# Patient Record
Sex: Female | Born: 1950 | Race: White | Hispanic: No | Marital: Married | Smoking: Never smoker
Health system: Southern US, Community
[De-identification: ages and names within clinical notes are randomized; demographics above are authoritative.]

## PROBLEM LIST (undated history)

## (undated) DIAGNOSIS — Z8781 Personal history of (healed) traumatic fracture: Secondary | ICD-10-CM

## (undated) DIAGNOSIS — L409 Psoriasis, unspecified: Secondary | ICD-10-CM

## (undated) DIAGNOSIS — I82409 Acute embolism and thrombosis of unspecified deep veins of unspecified lower extremity: Secondary | ICD-10-CM

## (undated) DIAGNOSIS — E785 Hyperlipidemia, unspecified: Secondary | ICD-10-CM

## (undated) DIAGNOSIS — M810 Age-related osteoporosis without current pathological fracture: Secondary | ICD-10-CM

## (undated) DIAGNOSIS — Z923 Personal history of irradiation: Secondary | ICD-10-CM

## (undated) DIAGNOSIS — Z170 Estrogen receptor positive status [ER+]: Secondary | ICD-10-CM

## (undated) DIAGNOSIS — C50812 Malignant neoplasm of overlapping sites of left female breast: Secondary | ICD-10-CM

## (undated) DIAGNOSIS — I2510 Atherosclerotic heart disease of native coronary artery without angina pectoris: Secondary | ICD-10-CM

## (undated) DIAGNOSIS — Z853 Personal history of malignant neoplasm of breast: Secondary | ICD-10-CM

## (undated) DIAGNOSIS — E039 Hypothyroidism, unspecified: Secondary | ICD-10-CM

## (undated) DIAGNOSIS — I10 Essential (primary) hypertension: Secondary | ICD-10-CM

## (undated) DIAGNOSIS — C50919 Malignant neoplasm of unspecified site of unspecified female breast: Secondary | ICD-10-CM

## (undated) DIAGNOSIS — H353290 Exudative age-related macular degeneration, unspecified eye, stage unspecified: Secondary | ICD-10-CM

## (undated) HISTORY — DX: Age-related osteoporosis without current pathological fracture: M81.0

## (undated) HISTORY — PX: BACK SURGERY: SHX140

## (undated) HISTORY — DX: Psoriasis, unspecified: L40.9

## (undated) HISTORY — DX: Estrogen receptor positive status (ER+): Z17.0

## (undated) HISTORY — PX: CARDIAC CATHETERIZATION: SHX172

## (undated) HISTORY — DX: Personal history of malignant neoplasm of breast: Z85.3

## (undated) HISTORY — DX: Essential (primary) hypertension: I10

## (undated) HISTORY — DX: Hyperlipidemia, unspecified: E78.5

## (undated) HISTORY — DX: Malignant neoplasm of overlapping sites of left female breast: C50.812

## (undated) HISTORY — DX: Personal history of irradiation: Z92.3

## (undated) HISTORY — DX: Acute embolism and thrombosis of unspecified deep veins of unspecified lower extremity: I82.409

## (undated) HISTORY — PX: EYE SURGERY: SHX253

## (undated) HISTORY — PX: TUBAL LIGATION: SHX77

---

## 1968-08-12 DIAGNOSIS — I82409 Acute embolism and thrombosis of unspecified deep veins of unspecified lower extremity: Secondary | ICD-10-CM

## 1968-08-12 HISTORY — DX: Acute embolism and thrombosis of unspecified deep veins of unspecified lower extremity: I82.409

## 1997-08-12 HISTORY — PX: FOOT SURGERY: SHX648

## 2004-11-13 ENCOUNTER — Ambulatory Visit: Payer: Self-pay | Admitting: Internal Medicine

## 2005-11-28 ENCOUNTER — Ambulatory Visit: Payer: Self-pay | Admitting: Internal Medicine

## 2005-12-20 ENCOUNTER — Ambulatory Visit: Payer: Self-pay | Admitting: Internal Medicine

## 2006-02-25 ENCOUNTER — Ambulatory Visit: Payer: Self-pay | Admitting: Gastroenterology

## 2006-12-16 ENCOUNTER — Ambulatory Visit: Payer: Self-pay | Admitting: Internal Medicine

## 2007-12-23 ENCOUNTER — Ambulatory Visit: Payer: Self-pay | Admitting: Internal Medicine

## 2007-12-29 ENCOUNTER — Ambulatory Visit: Payer: Self-pay | Admitting: Internal Medicine

## 2008-07-04 ENCOUNTER — Ambulatory Visit: Payer: Self-pay | Admitting: Internal Medicine

## 2008-12-29 ENCOUNTER — Ambulatory Visit: Payer: Self-pay | Admitting: Internal Medicine

## 2009-08-12 DIAGNOSIS — C50919 Malignant neoplasm of unspecified site of unspecified female breast: Secondary | ICD-10-CM

## 2009-08-12 DIAGNOSIS — Z923 Personal history of irradiation: Secondary | ICD-10-CM

## 2009-08-12 HISTORY — DX: Personal history of irradiation: Z92.3

## 2009-08-12 HISTORY — DX: Malignant neoplasm of unspecified site of unspecified female breast: C50.919

## 2010-01-17 ENCOUNTER — Ambulatory Visit: Payer: Self-pay | Admitting: Internal Medicine

## 2010-01-29 ENCOUNTER — Ambulatory Visit: Payer: Self-pay | Admitting: Internal Medicine

## 2010-02-13 ENCOUNTER — Ambulatory Visit: Payer: Self-pay | Admitting: Surgery

## 2010-02-16 LAB — PATHOLOGY REPORT

## 2010-03-01 ENCOUNTER — Ambulatory Visit: Payer: Self-pay | Admitting: Surgery

## 2010-03-08 ENCOUNTER — Ambulatory Visit: Payer: Self-pay | Admitting: Surgery

## 2010-03-08 HISTORY — PX: BREAST BIOPSY: SHX20

## 2010-03-12 ENCOUNTER — Ambulatory Visit: Payer: Self-pay | Admitting: Internal Medicine

## 2010-03-13 LAB — PATHOLOGY REPORT

## 2010-03-25 DIAGNOSIS — Z853 Personal history of malignant neoplasm of breast: Secondary | ICD-10-CM

## 2010-03-25 HISTORY — DX: Personal history of malignant neoplasm of breast: Z85.3

## 2010-03-28 ENCOUNTER — Ambulatory Visit: Payer: Self-pay | Admitting: Surgery

## 2010-03-28 HISTORY — PX: BREAST LUMPECTOMY: SHX2

## 2010-04-03 LAB — PATHOLOGY REPORT

## 2010-04-11 ENCOUNTER — Ambulatory Visit: Payer: Self-pay | Admitting: Internal Medicine

## 2010-04-12 ENCOUNTER — Ambulatory Visit: Payer: Self-pay | Admitting: Internal Medicine

## 2010-04-25 ENCOUNTER — Ambulatory Visit: Payer: Self-pay | Admitting: Internal Medicine

## 2010-05-12 ENCOUNTER — Ambulatory Visit: Payer: Self-pay | Admitting: Internal Medicine

## 2010-06-12 ENCOUNTER — Ambulatory Visit: Payer: Self-pay | Admitting: Internal Medicine

## 2010-07-12 ENCOUNTER — Ambulatory Visit: Payer: Self-pay | Admitting: Internal Medicine

## 2010-08-12 ENCOUNTER — Ambulatory Visit: Payer: Self-pay | Admitting: Internal Medicine

## 2010-09-21 ENCOUNTER — Ambulatory Visit: Payer: Self-pay | Admitting: Internal Medicine

## 2010-10-11 ENCOUNTER — Ambulatory Visit: Payer: Self-pay | Admitting: Internal Medicine

## 2010-12-26 ENCOUNTER — Ambulatory Visit: Payer: Self-pay | Admitting: Internal Medicine

## 2011-01-11 ENCOUNTER — Ambulatory Visit: Payer: Self-pay | Admitting: Internal Medicine

## 2011-02-10 ENCOUNTER — Ambulatory Visit: Payer: Self-pay | Admitting: Internal Medicine

## 2011-03-28 ENCOUNTER — Ambulatory Visit: Payer: Self-pay | Admitting: Internal Medicine

## 2011-05-29 ENCOUNTER — Ambulatory Visit: Payer: Self-pay | Admitting: Internal Medicine

## 2011-06-13 ENCOUNTER — Ambulatory Visit: Payer: Self-pay | Admitting: Internal Medicine

## 2011-07-13 ENCOUNTER — Ambulatory Visit: Payer: Self-pay | Admitting: Internal Medicine

## 2011-09-30 ENCOUNTER — Ambulatory Visit: Payer: Self-pay | Admitting: Surgery

## 2011-10-02 ENCOUNTER — Ambulatory Visit: Payer: Self-pay | Admitting: Internal Medicine

## 2011-10-02 LAB — CBC CANCER CENTER
Basophil #: 0 x10 3/mm (ref 0.0–0.1)
Eosinophil #: 0.2 x10 3/mm (ref 0.0–0.7)
HCT: 40.9 % (ref 35.0–47.0)
Lymphocyte #: 1.1 x10 3/mm (ref 1.0–3.6)
MCHC: 34.3 g/dL (ref 32.0–36.0)
MCV: 94 fL (ref 80–100)
Monocyte #: 0.7 x10 3/mm (ref 0.0–0.7)
Monocyte %: 11 %
Neutrophil #: 4 x10 3/mm (ref 1.4–6.5)
Platelet: 232 x10 3/mm (ref 150–440)
RDW: 13.2 % (ref 11.5–14.5)
WBC: 6 x10 3/mm (ref 3.6–11.0)

## 2011-10-02 LAB — HEPATIC FUNCTION PANEL A (ARMC)
Alkaline Phosphatase: 84 U/L (ref 50–136)
Bilirubin,Total: 0.4 mg/dL (ref 0.2–1.0)
SGOT(AST): 26 U/L (ref 15–37)
Total Protein: 7.5 g/dL (ref 6.4–8.2)

## 2011-10-02 LAB — CREATININE, SERUM: EGFR (Non-African Amer.): >60

## 2011-10-11 ENCOUNTER — Ambulatory Visit: Payer: Self-pay | Admitting: Internal Medicine

## 2012-03-30 ENCOUNTER — Ambulatory Visit: Payer: Self-pay | Admitting: Internal Medicine

## 2012-05-08 ENCOUNTER — Ambulatory Visit: Payer: Self-pay | Admitting: Internal Medicine

## 2012-05-08 LAB — HEPATIC FUNCTION PANEL A (ARMC)
Albumin: 3.6 g/dL (ref 3.4–5.0)
Alkaline Phosphatase: 75 U/L (ref 50–136)
Bilirubin, Direct: 0.1 mg/dL (ref 0.00–0.20)
Bilirubin,Total: 0.3 mg/dL (ref 0.2–1.0)
SGOT(AST): 23 U/L (ref 15–37)
SGPT (ALT): 20 U/L (ref 12–78)
Total Protein: 7.5 g/dL (ref 6.4–8.2)

## 2012-05-08 LAB — CBC CANCER CENTER
Basophil #: 0 x10 3/mm (ref 0.0–0.1)
Basophil %: 0.6 %
Eosinophil #: 0.1 x10 3/mm (ref 0.0–0.7)
Eosinophil %: 1.4 %
HCT: 42.9 % (ref 35.0–47.0)
HGB: 13.9 g/dL (ref 12.0–16.0)
Lymphocyte #: 1.2 x10 3/mm (ref 1.0–3.6)
Lymphocyte %: 18.3 %
MCH: 31.7 pg (ref 26.0–34.0)
MCHC: 32.5 g/dL (ref 32.0–36.0)
MCV: 98 fL (ref 80–100)
Monocyte #: 0.7 x10 3/mm (ref 0.2–0.9)
Monocyte %: 11.2 %
Neutrophil #: 4.5 x10 3/mm (ref 1.4–6.5)
Neutrophil %: 68.5 %
Platelet: 227 x10 3/mm (ref 150–440)
RBC: 4.38 10*6/uL (ref 3.80–5.20)
RDW: 13.1 % (ref 11.5–14.5)
WBC: 6.5 x10 3/mm (ref 3.6–11.0)

## 2012-05-08 LAB — CREATININE, SERUM: EGFR (Non-African Amer.): >60

## 2012-05-12 ENCOUNTER — Ambulatory Visit: Payer: Self-pay | Admitting: Internal Medicine

## 2012-06-24 ENCOUNTER — Ambulatory Visit: Payer: Self-pay | Admitting: Internal Medicine

## 2012-07-12 ENCOUNTER — Ambulatory Visit: Payer: Self-pay | Admitting: Internal Medicine

## 2012-08-12 ENCOUNTER — Ambulatory Visit: Payer: Self-pay | Admitting: Internal Medicine

## 2013-04-01 ENCOUNTER — Ambulatory Visit: Payer: Self-pay | Admitting: Internal Medicine

## 2013-05-07 ENCOUNTER — Ambulatory Visit: Payer: Self-pay | Admitting: Internal Medicine

## 2013-05-10 LAB — HEPATIC FUNCTION PANEL A (ARMC): Bilirubin, Direct: 0.1 mg/dL (ref 0.00–0.20)

## 2013-05-10 LAB — CBC CANCER CENTER
Basophil %: 0.9 %
Eosinophil #: 0.1 x10 3/mm (ref 0.0–0.7)
Eosinophil %: 1.5 %
HGB: 14.5 g/dL (ref 12.0–16.0)
Lymphocyte #: 1.4 x10 3/mm (ref 1.0–3.6)
MCH: 32 pg (ref 26.0–34.0)
MCHC: 33.4 g/dL (ref 32.0–36.0)
MCV: 96 fL (ref 80–100)
Monocyte #: 0.8 x10 3/mm (ref 0.2–0.9)
Monocyte %: 10.1 %
Neutrophil #: 5.5 x10 3/mm (ref 1.4–6.5)
Neutrophil %: 69.2 %
RBC: 4.55 10*6/uL (ref 3.80–5.20)
RDW: 12.9 % (ref 11.5–14.5)

## 2013-05-10 LAB — CREATININE, SERUM
Creatinine: 0.86 mg/dL (ref 0.60–1.30)
EGFR (Non-African Amer.): >60

## 2013-05-12 ENCOUNTER — Ambulatory Visit: Payer: Self-pay | Admitting: Internal Medicine

## 2013-06-23 ENCOUNTER — Ambulatory Visit: Payer: Self-pay | Admitting: Radiation Oncology

## 2013-07-12 ENCOUNTER — Ambulatory Visit: Payer: Self-pay | Admitting: Radiation Oncology

## 2014-04-04 ENCOUNTER — Ambulatory Visit: Payer: Self-pay | Admitting: Internal Medicine

## 2014-05-11 ENCOUNTER — Ambulatory Visit: Payer: Self-pay | Admitting: Internal Medicine

## 2014-05-11 LAB — CBC CANCER CENTER
Basophil #: 0 x10 3/mm (ref 0.0–0.1)
Basophil %: 0.5 %
Eosinophil #: 0.1 x10 3/mm (ref 0.0–0.7)
Eosinophil %: 1.2 %
HCT: 44.6 % (ref 35.0–47.0)
HGB: 14.5 g/dL (ref 12.0–16.0)
LYMPHS PCT: 19.6 %
Lymphocyte #: 1.2 x10 3/mm (ref 1.0–3.6)
MCH: 31.4 pg (ref 26.0–34.0)
MCHC: 32.5 g/dL (ref 32.0–36.0)
MCV: 96 fL (ref 80–100)
MONO ABS: 0.6 x10 3/mm (ref 0.2–0.9)
Monocyte %: 10 %
NEUTROS ABS: 4.3 x10 3/mm (ref 1.4–6.5)
Neutrophil %: 68.7 %
PLATELETS: 259 x10 3/mm (ref 150–440)
RBC: 4.62 10*6/uL (ref 3.80–5.20)
RDW: 12.9 % (ref 11.5–14.5)
WBC: 6.2 x10 3/mm (ref 3.6–11.0)

## 2014-05-11 LAB — CREATININE, SERUM
Creatinine: 0.77 mg/dL (ref 0.60–1.30)
EGFR (Non-African Amer.): >60

## 2014-05-11 LAB — HEPATIC FUNCTION PANEL A (ARMC)
ALBUMIN: 3.9 g/dL (ref 3.4–5.0)
Alkaline Phosphatase: 73 U/L
Bilirubin, Direct: 0.1 mg/dL (ref 0.00–0.20)
Bilirubin,Total: 0.3 mg/dL (ref 0.2–1.0)
SGOT(AST): 25 U/L (ref 15–37)
SGPT (ALT): 26 U/L
Total Protein: 7.5 g/dL (ref 6.4–8.2)

## 2014-05-12 ENCOUNTER — Ambulatory Visit: Payer: Self-pay | Admitting: Internal Medicine

## 2014-07-22 ENCOUNTER — Ambulatory Visit: Payer: Self-pay | Admitting: Internal Medicine

## 2014-08-12 ENCOUNTER — Ambulatory Visit: Payer: Self-pay | Admitting: Internal Medicine

## 2015-02-07 ENCOUNTER — Other Ambulatory Visit: Payer: Self-pay

## 2015-02-07 DIAGNOSIS — Z853 Personal history of malignant neoplasm of breast: Secondary | ICD-10-CM

## 2015-04-06 ENCOUNTER — Other Ambulatory Visit: Payer: Self-pay | Admitting: Internal Medicine

## 2015-04-06 ENCOUNTER — Ambulatory Visit: Payer: Self-pay

## 2015-04-06 ENCOUNTER — Ambulatory Visit
Admission: RE | Admit: 2015-04-06 | Discharge: 2015-04-06 | Disposition: A | Discharge status: Home / Self Care | Payer: Medicare Other | Source: Ambulatory Visit | Attending: Internal Medicine | Admitting: Internal Medicine

## 2015-04-06 DIAGNOSIS — Z853 Personal history of malignant neoplasm of breast: Secondary | ICD-10-CM | POA: Insufficient documentation

## 2015-04-06 HISTORY — DX: Malignant neoplasm of unspecified site of unspecified female breast: C50.919

## 2015-05-08 ENCOUNTER — Other Ambulatory Visit: Payer: Self-pay | Admitting: Internal Medicine

## 2015-05-12 ENCOUNTER — Inpatient Hospital Stay: Payer: Medicare Other | Admitting: Internal Medicine

## 2015-05-12 ENCOUNTER — Inpatient Hospital Stay: Payer: Medicare Other

## 2015-05-25 ENCOUNTER — Inpatient Hospital Stay: Payer: Medicare Other | Attending: Family Medicine | Admitting: Internal Medicine

## 2015-05-25 ENCOUNTER — Encounter: Payer: Self-pay | Admitting: *Deleted

## 2015-05-25 ENCOUNTER — Other Ambulatory Visit: Payer: Self-pay | Admitting: *Deleted

## 2015-05-25 ENCOUNTER — Inpatient Hospital Stay: Payer: Medicare Other

## 2015-05-25 VITALS — BP 149/86 | HR 75 | Temp 98.0°F | Ht 60.0 in | Wt 127.4 lb

## 2015-05-25 DIAGNOSIS — Z79811 Long term (current) use of aromatase inhibitors: Secondary | ICD-10-CM | POA: Diagnosis not present

## 2015-05-25 DIAGNOSIS — C50912 Malignant neoplasm of unspecified site of left female breast: Secondary | ICD-10-CM | POA: Diagnosis present

## 2015-05-25 DIAGNOSIS — Z7982 Long term (current) use of aspirin: Secondary | ICD-10-CM | POA: Diagnosis not present

## 2015-05-25 DIAGNOSIS — E785 Hyperlipidemia, unspecified: Secondary | ICD-10-CM | POA: Insufficient documentation

## 2015-05-25 DIAGNOSIS — I10 Essential (primary) hypertension: Secondary | ICD-10-CM | POA: Diagnosis not present

## 2015-05-25 DIAGNOSIS — Z86718 Personal history of other venous thrombosis and embolism: Secondary | ICD-10-CM | POA: Insufficient documentation

## 2015-05-25 DIAGNOSIS — L409 Psoriasis, unspecified: Secondary | ICD-10-CM | POA: Insufficient documentation

## 2015-05-25 DIAGNOSIS — M810 Age-related osteoporosis without current pathological fracture: Secondary | ICD-10-CM | POA: Insufficient documentation

## 2015-05-25 DIAGNOSIS — Z79899 Other long term (current) drug therapy: Secondary | ICD-10-CM | POA: Insufficient documentation

## 2015-05-25 DIAGNOSIS — D0592 Unspecified type of carcinoma in situ of left breast: Secondary | ICD-10-CM

## 2015-05-25 DIAGNOSIS — Z170 Estrogen receptor positive status [ER+]: Secondary | ICD-10-CM | POA: Diagnosis not present

## 2015-05-25 LAB — CBC WITH DIFFERENTIAL/PLATELET
Basophils Absolute: 0.1 10*3/uL (ref 0–0.1)
Basophils Relative: 1 %
Eosinophils Absolute: 0.1 10*3/uL (ref 0–0.7)
Eosinophils Relative: 2 %
HEMATOCRIT: 41 % (ref 35.0–47.0)
Hemoglobin: 13.9 g/dL (ref 12.0–16.0)
LYMPHS ABS: 1.2 10*3/uL (ref 1.0–3.6)
Lymphocytes Relative: 18 %
MCH: 31.5 pg (ref 26.0–34.0)
MCHC: 33.8 g/dL (ref 32.0–36.0)
MCV: 93.2 fL (ref 80.0–100.0)
MONO ABS: 0.7 10*3/uL (ref 0.2–0.9)
MONOS PCT: 11 %
NEUTROS ABS: 4.4 10*3/uL (ref 1.4–6.5)
Neutrophils Relative %: 68 %
Platelets: 273 10*3/uL (ref 150–440)
RBC: 4.4 MIL/uL (ref 3.80–5.20)
RDW: 12.7 % (ref 11.5–14.5)
WBC: 6.4 10*3/uL (ref 3.6–11.0)

## 2015-05-25 LAB — HEPATIC FUNCTION PANEL
ALT: 15 U/L (ref 14–54)
AST: 26 U/L (ref 15–41)
Albumin: 3.8 g/dL (ref 3.5–5.0)
Alkaline Phosphatase: 71 U/L (ref 38–126)
BILIRUBIN TOTAL: 0.5 mg/dL (ref 0.3–1.2)
Total Protein: 7.2 g/dL (ref 6.5–8.1)

## 2015-05-25 LAB — CREATININE, SERUM
Creatinine, Ser: 0.71 mg/dL (ref 0.44–1.00)
GFR calc Af Amer: >60 mL/min (ref >60)
GFR calc non Af Amer: >60 mL/min (ref >60)

## 2015-05-25 NOTE — Progress Notes (Signed)
[place] OFFICE PROGRESS NOTE  No care team member to display   SUMMARY OF ONCOLOGIC HISTORY:  # LEFT BREAST CA STAGE I ER/PR POS; Her 2 NEU-NEG s/p Lump & RT; LOW RISK ONCOTYPE- No chemo; NOV 2011- START ARIMIDEX  # OSTEOPOROSIS- on Fosomax + Vit D & ca ; BMD- STABLE osteporosis   INTERVAL HISTORY:  A pleasant 64 year old female patient with above history of stage I breast cancer currently on adjuvant Arimidex is here for follow-up. Patient denies any unusual bone pain or unusual muscle aches or joint pains. She denies any significant hot flashes.  Denies any new lumps or bumps. Denies any unusual weight loss nausea vomiting or chest pain or cough.  REVIEW OF SYSTEMS:  A complete 10 point review of system is done which is negative except mentioned above/history of present illness.   PAST MEDICAL HISTORY :  Past Medical History  Diagnosis Date  . History of left breast cancer 03/25/2010    stage 1, clinical infiltrating carcinoma, tumor size 0.9 cm; margins clear, lymph nodes negative, ER/PR positive/HER2 negative  . History of radiation therapy   . Hyperlipidemia   . Hypertension   . DVT of leg (deep venous thrombosis) ([place]) 1970    while on birth control  . Psoriasis   . Osteoporosis     PAST SURGICAL HISTORY :   Past Surgical History  Procedure Laterality Date  . Breast biopsy Left 2011    positive  . Breast lumpectomy Left   . Foot surgery  1999  . Tubal ligation      FAMILY HISTORY :   Family History  Problem Relation Age of Onset  . Breast cancer Maternal Aunt 70  . Diabetes    . Hypertension    . Stroke      SOCIAL HISTORY:   Social History  Substance Use Topics  . Smoking status: Never Smoker   . Smokeless tobacco: Never Used  . Alcohol Use: 0.0 oz/week    0 Standard drinks or equivalent per week     Comment: occassional alcohol use 2-3 times a week    ALLERGIES:  is allergic to formaldehyde.  MEDICATIONS:  Current Outpatient  Prescriptions  Medication Sig Dispense Refill  . alendronate (FOSAMAX) 70 MG tablet Take by mouth.    Marland Kitchen anastrozole (ARIMIDEX) 1 MG tablet TAKE 1 TABLET BY MOUTH EVERY DAY 90 tablet 0  . anastrozole (ARIMIDEX) 1 MG tablet Take by mouth.    Marland Kitchen aspirin 81 MG chewable tablet Chew by mouth.    . Calcium Carbonate-Vitamin D (CALCIUM 600+D) 600-200 MG-UNIT TABS Take by mouth.    . Cholecalciferol (VITAMIN D3) 2000 UNITS capsule Take by mouth.    . levothyroxine (SYNTHROID, LEVOTHROID) 50 MCG tablet Take by mouth.    . Multiple Vitamin (MULTI-VITAMINS) TABS Take by mouth.    . Multiple Vitamins-Minerals (ICAPS AREDS 2) CAPS Take 2 capsules by mouth 2 (two) times daily at 8 am and 10 pm.    . simvastatin (ZOCOR) 40 MG tablet Take by mouth.     No current facility-administered medications for this visit.    PHYSICAL EXAMINATION: ECOG PERFORMANCE STATUS: 0 - Asymptomatic  BP 149/86 mmHg  Pulse 75  Temp(Src) 98 F (36.7 C) (Oral)  Ht 5' (1.524 m)  Wt 127 lb 6.8 oz (57.8 kg)  BMI 24.89 kg/m2  SpO2 97%  Filed Weights   05/25/15 1144  Weight: 127 lb 6.8 oz (57.8 kg)    GENERAL: Well-nourished  well-developed; Alert, no distress and comfortable.   Alone. EYES: no pallor or icterus OROPHARYNX: no thrush or ulceration; good dentition  NECK: supple, no masses felt LYMPH:  no palpable lymphadenopathy in the cervical, axillary or inguinal regions LUNGS: clear to auscultation and  No wheeze or crackles HEART/CVS: regular rate & rhythm and no murmurs; No lower extremity edema ABDOMEN:abdomen soft, non-tender and normal bowel sounds Musculoskeletal:no cyanosis of digits and no clubbing  PSYCH: alert & oriented x 3 with fluent speech NEURO: no focal motor/sensory deficits SKIN:  no rashes or significant lesions Breast exam- right breast- no nipple changes or skin changes. No lumps felt. Left breast-lumpectomy scar noted; no masses felt no other skin changes noted.   LABORATORY DATA:  I have  reviewed the data as listed    Component Value Date/Time   CREATININE 0.71 05/25/2015 1121   CREATININE 0.77 05/11/2014 1030   PROT 7.2 05/25/2015 1121   PROT 7.5 05/11/2014 1030   ALBUMIN 3.8 05/25/2015 1121   ALBUMIN 3.9 05/11/2014 1030   AST 26 05/25/2015 1121   AST 25 05/11/2014 1030   ALT 15 05/25/2015 1121   ALT 26 05/11/2014 1030   ALKPHOS 71 05/25/2015 1121   ALKPHOS 73 05/11/2014 1030   BILITOT 0.5 05/25/2015 1121   BILITOT 0.3 05/11/2014 1030   GFRNONAA >60 05/25/2015 1121   GFRNONAA >60 05/11/2014 1030   GFRNONAA >60 05/10/2013 1042   GFRAA >60 05/25/2015 1121   GFRAA >60 05/11/2014 1030   GFRAA >60 05/10/2013 1042    No results found for: SPEP, UPEP  Lab Results  Component Value Date   WBC 6.4 05/25/2015   NEUTROABS 4.4 05/25/2015   HGB 13.9 05/25/2015   HCT 41.0 05/25/2015   MCV 93.2 05/25/2015   PLT 273 05/25/2015      Chemistry      Component Value Date/Time   CREATININE 0.71 05/25/2015 1121   CREATININE 0.77 05/11/2014 1030      Component Value Date/Time   ALKPHOS 71 05/25/2015 1121   ALKPHOS 73 05/11/2014 1030   AST 26 05/25/2015 1121   AST 25 05/11/2014 1030   ALT 15 05/25/2015 1121   ALT 26 05/11/2014 1030   BILITOT 0.5 05/25/2015 1121   BILITOT 0.3 05/11/2014 1030       RADIOGRAPHIC STUDIES: I have personally reviewed the radiological images as listed and agreed with the findings in the report. No results found.   ASSESSMENT & PLAN:   # Stage I ER/PR positive HER-2/neu negative breast cancer low risk Oncotype. On adjuvant Arimidex for the 5 years. Clinically no evidence of recurrence noted.  I had a long discussion with the patient regarding the new data from the soft/text- clinical trials regarding the slight benefit of 10 years of antihormone therapy over 5 years. The benefit is mostly noted in the high risk patients/younger patients/received chemotherapy. Patient after the long discussion felt that she is tolerating the Arimidex  very well without any major side effects and she would want her to continue Arimidex at this time.  # I discussed regarding osteoporosis- she is currently on Fosamax and calcium vitamin D. She'll follow-up with PCP regarding continued bone density test every 2 years or so.    Orders Placed This Encounter  Procedures  . MM Digital Diagnostic Bilat    Standing Status: Future     Number of Occurrences:      Standing Expiration Date: 05/24/2016    Scheduling Instructions:     Schedule mammogram  in 1 year    Order Specific Question:  Reason for Exam (SYMPTOM  OR DIAGNOSIS REQUIRED)    Answer:  history of left breast cancer at [facility]    Order Specific Question:  Preferred imaging location?    Answer:  [facility]   All questions were answered. The patient knows to call the clinic with any problems, questions or concerns. No barriers to learning was detected. I spent 25 minutes counseling the patient face to face. The total time spent in the appointment was 30 minutes and more than 50% was on counseling and review of test results     Cammie Sickle, MD 05/25/2015 4:47 PM

## 2015-05-25 NOTE — Progress Notes (Signed)
Patient here for follow up breast cancer.

## 2015-05-30 ENCOUNTER — Other Ambulatory Visit: Payer: Self-pay | Admitting: *Deleted

## 2015-05-30 DIAGNOSIS — C50912 Malignant neoplasm of unspecified site of left female breast: Secondary | ICD-10-CM

## 2015-07-24 ENCOUNTER — Ambulatory Visit: Payer: Medicare Other | Attending: Radiation Oncology | Admitting: Radiation Oncology

## 2015-08-08 ENCOUNTER — Telehealth: Payer: Self-pay | Admitting: *Deleted

## 2015-08-08 MED ORDER — ANASTROZOLE 1 MG PO TABS: 1.0000 mg | ORAL_TABLET | Freq: Every day | ORAL | Status: DC

## 2015-08-08 NOTE — Telephone Encounter (Signed)
Escribed

## 2015-10-30 ENCOUNTER — Other Ambulatory Visit: Payer: Self-pay | Admitting: *Deleted

## 2015-10-30 DIAGNOSIS — C50912 Malignant neoplasm of unspecified site of left female breast: Secondary | ICD-10-CM

## 2015-10-30 MED ORDER — ANASTROZOLE 1 MG PO TABS: 1.0000 mg | ORAL_TABLET | Freq: Every day | ORAL | Status: DC

## 2015-10-30 NOTE — Progress Notes (Signed)
Received fax from pharmacy.  arimidex 1 mg rx sent to [facility].

## 2016-04-12 ENCOUNTER — Ambulatory Visit
Admission: RE | Admit: 2016-04-12 | Discharge: 2016-04-12 | Disposition: A | Discharge status: Home / Self Care | Payer: Medicare Other | Source: Ambulatory Visit | Attending: Internal Medicine | Admitting: Internal Medicine

## 2016-04-12 ENCOUNTER — Other Ambulatory Visit: Payer: Self-pay | Admitting: Internal Medicine

## 2016-04-12 DIAGNOSIS — C50912 Malignant neoplasm of unspecified site of left female breast: Secondary | ICD-10-CM

## 2016-05-23 ENCOUNTER — Encounter (INDEPENDENT_AMBULATORY_CARE_PROVIDER_SITE_OTHER): Payer: Self-pay

## 2016-05-23 ENCOUNTER — Encounter: Payer: Self-pay | Admitting: Internal Medicine

## 2016-05-23 ENCOUNTER — Inpatient Hospital Stay (HOSPITAL_BASED_OUTPATIENT_CLINIC_OR_DEPARTMENT_OTHER): Payer: Medicare Other | Admitting: Internal Medicine

## 2016-05-23 ENCOUNTER — Inpatient Hospital Stay: Payer: Medicare Other | Attending: Internal Medicine

## 2016-05-23 DIAGNOSIS — Z79899 Other long term (current) drug therapy: Secondary | ICD-10-CM

## 2016-05-23 DIAGNOSIS — M818 Other osteoporosis without current pathological fracture: Secondary | ICD-10-CM | POA: Insufficient documentation

## 2016-05-23 DIAGNOSIS — C50812 Malignant neoplasm of overlapping sites of left female breast: Secondary | ICD-10-CM | POA: Insufficient documentation

## 2016-05-23 DIAGNOSIS — Z170 Estrogen receptor positive status [ER+]: Secondary | ICD-10-CM | POA: Diagnosis not present

## 2016-05-23 DIAGNOSIS — Z86718 Personal history of other venous thrombosis and embolism: Secondary | ICD-10-CM | POA: Diagnosis not present

## 2016-05-23 DIAGNOSIS — Z923 Personal history of irradiation: Secondary | ICD-10-CM | POA: Insufficient documentation

## 2016-05-23 DIAGNOSIS — L409 Psoriasis, unspecified: Secondary | ICD-10-CM | POA: Insufficient documentation

## 2016-05-23 DIAGNOSIS — Z7982 Long term (current) use of aspirin: Secondary | ICD-10-CM | POA: Diagnosis not present

## 2016-05-23 DIAGNOSIS — Z79811 Long term (current) use of aromatase inhibitors: Secondary | ICD-10-CM | POA: Diagnosis not present

## 2016-05-23 DIAGNOSIS — E785 Hyperlipidemia, unspecified: Secondary | ICD-10-CM | POA: Diagnosis not present

## 2016-05-23 DIAGNOSIS — I10 Essential (primary) hypertension: Secondary | ICD-10-CM | POA: Diagnosis not present

## 2016-05-23 DIAGNOSIS — C50912 Malignant neoplasm of unspecified site of left female breast: Secondary | ICD-10-CM

## 2016-05-23 LAB — CBC WITH DIFFERENTIAL/PLATELET
BASOS ABS: 0.1 10*3/uL (ref 0–0.1)
BASOS PCT: 1 %
Eosinophils Absolute: 0.2 10*3/uL (ref 0–0.7)
Eosinophils Relative: 3 %
HEMATOCRIT: 41.5 % (ref 35.0–47.0)
HEMOGLOBIN: 14.3 g/dL (ref 12.0–16.0)
Lymphocytes Relative: 22 %
Lymphs Abs: 1.5 10*3/uL (ref 1.0–3.6)
MCH: 31.6 pg (ref 26.0–34.0)
MCHC: 34.5 g/dL (ref 32.0–36.0)
MCV: 91.8 fL (ref 80.0–100.0)
MONOS PCT: 10 %
Monocytes Absolute: 0.7 10*3/uL (ref 0.2–0.9)
NEUTROS ABS: 4.3 10*3/uL (ref 1.4–6.5)
NEUTROS PCT: 64 %
Platelets: 274 10*3/uL (ref 150–440)
RBC: 4.52 MIL/uL (ref 3.80–5.20)
RDW: 13 % (ref 11.5–14.5)
WBC: 6.7 10*3/uL (ref 3.6–11.0)

## 2016-05-23 LAB — COMPREHENSIVE METABOLIC PANEL
ALBUMIN: 4 g/dL (ref 3.5–5.0)
ALK PHOS: 72 U/L (ref 38–126)
ALT: 15 U/L (ref 14–54)
AST: 24 U/L (ref 15–41)
Anion gap: 10 (ref 5–15)
BILIRUBIN TOTAL: 0.5 mg/dL (ref 0.3–1.2)
BUN: 17 mg/dL (ref 6–20)
CALCIUM: 9.6 mg/dL (ref 8.9–10.3)
CO2: 24 mmol/L (ref 22–32)
CREATININE: 0.73 mg/dL (ref 0.44–1.00)
Chloride: 100 mmol/L — ABNORMAL LOW (ref 101–111)
GFR calc Af Amer: >60 mL/min (ref >60)
GFR calc non Af Amer: >60 mL/min (ref >60)
GLUCOSE: 100 mg/dL — AB (ref 65–99)
Potassium: 4.1 mmol/L (ref 3.5–5.1)
Sodium: 134 mmol/L — ABNORMAL LOW (ref 135–145)
TOTAL PROTEIN: 7.4 g/dL (ref 6.5–8.1)

## 2016-05-23 NOTE — Progress Notes (Signed)
No changes and no concerns since last visit

## 2016-05-23 NOTE — Assessment & Plan Note (Addendum)
#  Stage I ER/PR positive HER-2/neu negative breast cancer low risk Oncotype. On adjuvant Arimidex.  Clinically no evidence of recurrence noted. Tolerating Arimidex without any side effects  # Discussed with the patient regarding use of breast cancer index which would help [place]- with the length of endocrine therapy. She is interested.   # History of osteoporosis on Fosamax calcium and vitamin D follows up with PCP  # We will call the patient regarding- results of the breast cancer index when available- regarding the continuation or discontinuation of AI.  # Otherwise patient will follow-up with [place] in 12 months labs.

## 2016-05-23 NOTE — Progress Notes (Signed)
[place] OFFICE PROGRESS NOTE  No care team member to display   SUMMARY OF ONCOLOGIC HISTORY: Oncology History   # LEFT BREAST CA STAGE I ER/PR POS; Her 2 NEU-NEG s/p Lump & RT; LOW RISK ONCOTYPE- No chemo; NOV 2011- START ARIMIDEX  OCT 2017- BREAST CANCER INDEX -pending.   # OSTEOPOROSIS- on Fosomax + Vit D & ca ; BMD- STABLE osteporosis      Carcinoma of overlapping sites of left breast in female, estrogen receptor positive ([place])   05/23/2016 Initial Diagnosis    Carcinoma of overlapping sites of left breast in female, estrogen receptor positive ([place])       INTERVAL HISTORY:  A pleasant 65 year old female patient with above history of stage I breast cancer currently on adjuvant Arimidex is here for follow-up.    Denies any new lumps or bumps. Patient denies any unusual bone pain or unusual muscle aches or joint pains. She denies any significant hot flashes. . Denies any unusual weight loss nausea vomiting or chest pain or cough.  REVIEW OF SYSTEMS:  A complete 10 point review of system is done which is negative except mentioned above/history of present illness.   PAST MEDICAL HISTORY :  Past Medical History:  Diagnosis Date  . Breast cancer ([place]) 2011   LT LUMPECTOMY WITH RADIATION  . DVT of leg (deep venous thrombosis) ([place]) 1970   while on birth control  . History of left breast cancer 03/25/2010   stage 1, clinical infiltrating carcinoma, tumor size 0.9 cm; margins clear, lymph nodes negative, ER/PR positive/HER2 negative  . History of radiation therapy   . Hyperlipidemia   . Hypertension   . Osteoporosis   . Psoriasis     PAST SURGICAL HISTORY :   Past Surgical History:  Procedure Laterality Date  . BREAST BIOPSY Left 2011   positive  . BREAST LUMPECTOMY Left   . FOOT SURGERY  1999  . TUBAL LIGATION      FAMILY HISTORY :   Family History  Problem Relation Age of Onset  . Breast cancer Maternal Aunt 70  . Diabetes    . Hypertension     . Stroke      SOCIAL HISTORY:   Social History  Substance Use Topics  . Smoking status: Never Smoker  . Smokeless tobacco: Never Used  . Alcohol use 0.0 oz/week     Comment: occassional alcohol use 2-3 times a week    ALLERGIES:  is allergic to formaldehyde.  MEDICATIONS:  Current Outpatient Prescriptions  Medication Sig Dispense Refill  . alendronate (FOSAMAX) 70 MG tablet Take by mouth.    Marland Kitchen anastrozole (ARIMIDEX) 1 MG tablet Take 1 tablet (1 mg total) by mouth daily. 90 tablet 4  . aspirin 81 MG chewable tablet Chew by mouth.    . Calcium Carbonate-Vitamin D (CALCIUM 600+D) 600-200 MG-UNIT TABS Take by mouth.    . Cholecalciferol (VITAMIN D3) 2000 UNITS capsule Take by mouth.    . levothyroxine (SYNTHROID, LEVOTHROID) 50 MCG tablet Take by mouth.    . Multiple Vitamin (MULTI-VITAMINS) TABS Take by mouth.    . Multiple Vitamins-Minerals (ICAPS AREDS 2) CAPS Take 2 capsules by mouth 2 (two) times daily at 8 am and 10 pm.    . simvastatin (ZOCOR) 40 MG tablet Take by mouth.     No current facility-administered medications for this visit.     PHYSICAL EXAMINATION: ECOG PERFORMANCE STATUS: 0 - Asymptomatic  BP (!) 157/83 (BP Location: Left Arm,  Patient Position: Sitting)   Pulse 67   Temp 97.1 F (36.2 C) (Tympanic)   Resp 17   Ht 5' (1.524 m)   Wt 126 lb 3.2 oz (57.2 kg)   BMI 24.65 kg/m   Filed Weights   05/23/16 1119  Weight: 126 lb 3.2 oz (57.2 kg)    GENERAL: Well-nourished well-developed; Alert, no distress and comfortable.   Alone. EYES: no pallor or icterus OROPHARYNX: no thrush or ulceration; good dentition  NECK: supple, no masses felt LYMPH:  no palpable lymphadenopathy in the cervical, axillary or inguinal regions LUNGS: clear to auscultation and  No wheeze or crackles HEART/CVS: regular rate & rhythm and no murmurs; No lower extremity edema ABDOMEN:abdomen soft, non-tender and normal bowel sounds Musculoskeletal:no cyanosis of digits and no  clubbing  PSYCH: alert & oriented x 3 with fluent speech NEURO: no focal motor/sensory deficits SKIN:  no rashes or significant lesions Breast exam- right breast- no nipple changes or skin changes. No lumps felt. Left breast-lumpectomy scar noted; no masses felt no other skin changes noted.   LABORATORY DATA:  I have reviewed the data as listed    Component Value Date/Time   NA 134 (L) 05/23/2016 1045   K 4.1 05/23/2016 1045   CL 100 (L) 05/23/2016 1045   CO2 24 05/23/2016 1045   GLUCOSE 100 (H) 05/23/2016 1045   BUN 17 05/23/2016 1045   CREATININE 0.73 05/23/2016 1045   CREATININE 0.77 05/11/2014 1030   CALCIUM 9.6 05/23/2016 1045   PROT 7.4 05/23/2016 1045   PROT 7.5 05/11/2014 1030   ALBUMIN 4.0 05/23/2016 1045   ALBUMIN 3.9 05/11/2014 1030   AST 24 05/23/2016 1045   AST 25 05/11/2014 1030   ALT 15 05/23/2016 1045   ALT 26 05/11/2014 1030   ALKPHOS 72 05/23/2016 1045   ALKPHOS 73 05/11/2014 1030   BILITOT 0.5 05/23/2016 1045   BILITOT 0.3 05/11/2014 1030   GFRNONAA >60 05/23/2016 1045   GFRNONAA >60 05/11/2014 1030   GFRNONAA >60 05/10/2013 1042   GFRAA >60 05/23/2016 1045   GFRAA >60 05/11/2014 1030   GFRAA >60 05/10/2013 1042    No results found for: SPEP, UPEP  Lab Results  Component Value Date   WBC 6.7 05/23/2016   NEUTROABS 4.3 05/23/2016   HGB 14.3 05/23/2016   HCT 41.5 05/23/2016   MCV 91.8 05/23/2016   PLT 274 05/23/2016      Chemistry      Component Value Date/Time   NA 134 (L) 05/23/2016 1045   K 4.1 05/23/2016 1045   CL 100 (L) 05/23/2016 1045   CO2 24 05/23/2016 1045   BUN 17 05/23/2016 1045   CREATININE 0.73 05/23/2016 1045   CREATININE 0.77 05/11/2014 1030      Component Value Date/Time   CALCIUM 9.6 05/23/2016 1045   ALKPHOS 72 05/23/2016 1045   ALKPHOS 73 05/11/2014 1030   AST 24 05/23/2016 1045   AST 25 05/11/2014 1030   ALT 15 05/23/2016 1045   ALT 26 05/11/2014 1030   BILITOT 0.5 05/23/2016 1045   BILITOT 0.3 05/11/2014  1030       RADIOGRAPHIC STUDIES: I have personally reviewed the radiological images as listed and agreed with the findings in the report. No results found.   ASSESSMENT & PLAN:   Carcinoma of overlapping sites of left breast in female, estrogen receptor positive ([place]) # Stage I ER/PR positive HER-2/neu negative breast cancer low risk Oncotype. On adjuvant Arimidex.  Clinically no evidence  of recurrence noted. Tolerating Arimidex without any side effects  # Discussed with the patient regarding use of breast cancer index which would help [place]- with the length of endocrine therapy. She is interested.   # History of osteoporosis on Fosamax calcium and vitamin D follows up with PCP  # We will call the patient regarding- results of the breast cancer index when available- regarding the continuation or discontinuation of AI.  # Otherwise patient will follow-up with [place] in 12 months labs. # 25 minutes face-to-face with the patient discussing the above plan of care; more than 50% of time spent on prognosis/ natural history; counseling and coordination.   Orders Placed This Encounter  Procedures  . CBC with Differential    Standing Status:   Future    Standing Expiration Date:   05/23/2017  . Comprehensive metabolic panel    Standing Status:   Future    Standing Expiration Date:   05/23/2017       Cammie Sickle, MD 05/26/2016 12:49 PM

## 2016-05-29 ENCOUNTER — Encounter: Payer: Self-pay | Admitting: *Deleted

## 2016-06-04 ENCOUNTER — Encounter: Payer: Self-pay | Admitting: Internal Medicine

## 2016-06-14 ENCOUNTER — Telehealth: Payer: Self-pay | Admitting: Internal Medicine

## 2016-06-14 NOTE — Telephone Encounter (Signed)
Spoke to pt re: results of BCI-low risk of late recurrence;  recommend stopping AI; she agrees

## 2017-03-24 ENCOUNTER — Other Ambulatory Visit: Payer: Self-pay | Admitting: Internal Medicine

## 2017-03-24 DIAGNOSIS — Z853 Personal history of malignant neoplasm of breast: Secondary | ICD-10-CM

## 2017-03-27 ENCOUNTER — Ambulatory Visit: Payer: Medicare Other | Admitting: Anesthesiology

## 2017-03-27 ENCOUNTER — Encounter
Admission: RE | Disposition: A | Discharge status: Home / Self Care | Payer: Self-pay | Source: Ambulatory Visit | Attending: Gastroenterology

## 2017-03-27 ENCOUNTER — Encounter: Payer: Self-pay | Admitting: *Deleted

## 2017-03-27 ENCOUNTER — Ambulatory Visit
Admission: RE | Admit: 2017-03-27 | Discharge: 2017-03-27 | Disposition: A | Discharge status: Home / Self Care | Payer: Medicare Other | Source: Ambulatory Visit | Attending: Gastroenterology | Admitting: Gastroenterology

## 2017-03-27 DIAGNOSIS — Z853 Personal history of malignant neoplasm of breast: Secondary | ICD-10-CM | POA: Insufficient documentation

## 2017-03-27 DIAGNOSIS — Z923 Personal history of irradiation: Secondary | ICD-10-CM | POA: Diagnosis not present

## 2017-03-27 DIAGNOSIS — M810 Age-related osteoporosis without current pathological fracture: Secondary | ICD-10-CM | POA: Diagnosis not present

## 2017-03-27 DIAGNOSIS — Z86010 Personal history of colonic polyps: Secondary | ICD-10-CM | POA: Insufficient documentation

## 2017-03-27 DIAGNOSIS — K5730 Diverticulosis of large intestine without perforation or abscess without bleeding: Secondary | ICD-10-CM | POA: Diagnosis not present

## 2017-03-27 DIAGNOSIS — Z1211 Encounter for screening for malignant neoplasm of colon: Secondary | ICD-10-CM | POA: Diagnosis not present

## 2017-03-27 DIAGNOSIS — E785 Hyperlipidemia, unspecified: Secondary | ICD-10-CM | POA: Diagnosis not present

## 2017-03-27 DIAGNOSIS — H3530 Unspecified macular degeneration: Secondary | ICD-10-CM | POA: Diagnosis not present

## 2017-03-27 DIAGNOSIS — Z7982 Long term (current) use of aspirin: Secondary | ICD-10-CM | POA: Diagnosis not present

## 2017-03-27 DIAGNOSIS — Z79899 Other long term (current) drug therapy: Secondary | ICD-10-CM | POA: Diagnosis not present

## 2017-03-27 DIAGNOSIS — I10 Essential (primary) hypertension: Secondary | ICD-10-CM | POA: Insufficient documentation

## 2017-03-27 DIAGNOSIS — D120 Benign neoplasm of cecum: Secondary | ICD-10-CM | POA: Insufficient documentation

## 2017-03-27 DIAGNOSIS — E039 Hypothyroidism, unspecified: Secondary | ICD-10-CM | POA: Insufficient documentation

## 2017-03-27 DIAGNOSIS — Z86718 Personal history of other venous thrombosis and embolism: Secondary | ICD-10-CM | POA: Diagnosis not present

## 2017-03-27 HISTORY — DX: Personal history of (healed) traumatic fracture: Z87.81

## 2017-03-27 HISTORY — DX: Hypothyroidism, unspecified: E03.9

## 2017-03-27 HISTORY — DX: Acute embolism and thrombosis of unspecified deep veins of unspecified lower extremity: I82.409

## 2017-03-27 HISTORY — PX: COLONOSCOPY WITH PROPOFOL: SHX5780

## 2017-03-27 HISTORY — DX: Exudative age-related macular degeneration, unspecified eye, stage unspecified: H35.3290

## 2017-03-27 SURGERY — COLONOSCOPY WITH PROPOFOL
Anesthesia: General

## 2017-03-27 MED ORDER — SODIUM CHLORIDE 0.9 % IV SOLN: INTRAVENOUS | Status: DC

## 2017-03-27 MED ORDER — FENTANYL CITRATE (PF) 100 MCG/2ML IJ SOLN
INTRAMUSCULAR | Status: DC | PRN
  Administered 2017-03-27: 50 ug via INTRAVENOUS

## 2017-03-27 MED ORDER — PROPOFOL 500 MG/50ML IV EMUL
INTRAVENOUS | Status: AC
  Filled 2017-03-27: qty 50

## 2017-03-27 MED ORDER — SODIUM CHLORIDE 0.9 % IV SOLN
INTRAVENOUS | Status: DC
  Administered 2017-03-27 (×2): via INTRAVENOUS

## 2017-03-27 MED ORDER — PROPOFOL 500 MG/50ML IV EMUL
INTRAVENOUS | Status: DC | PRN
  Administered 2017-03-27: 140 ug/kg/min via INTRAVENOUS

## 2017-03-27 MED ORDER — FENTANYL CITRATE (PF) 100 MCG/2ML IJ SOLN
INTRAMUSCULAR | Status: AC
  Filled 2017-03-27: qty 2

## 2017-03-27 MED ORDER — PROPOFOL 10 MG/ML IV BOLUS
INTRAVENOUS | Status: DC | PRN
  Administered 2017-03-27: 100 mg via INTRAVENOUS

## 2017-03-27 MED ORDER — PHENYLEPHRINE HCL 10 MG/ML IJ SOLN
INTRAMUSCULAR | Status: DC | PRN
  Administered 2017-03-27: 100 ug via INTRAVENOUS

## 2017-03-27 MED ORDER — LIDOCAINE 2% (20 MG/ML) 5 ML SYRINGE
INTRAMUSCULAR | Status: DC | PRN
  Administered 2017-03-27: 40 mg via INTRAVENOUS

## 2017-03-27 MED ORDER — MIDAZOLAM HCL 2 MG/2ML IJ SOLN
INTRAMUSCULAR | Status: AC
  Filled 2017-03-27: qty 2

## 2017-03-27 MED ORDER — MIDAZOLAM HCL 5 MG/5ML IJ SOLN
INTRAMUSCULAR | Status: DC | PRN
  Administered 2017-03-27: 1 mg via INTRAVENOUS

## 2017-03-27 NOTE — Op Note (Signed)
[facility] Gastroenterology Patient Name: Katelyn Manning Procedure Date: 03/27/2017 11:49 AM MRN: [id number] Account #: [id number] Date of Birth: 06-04-1951 Admit Type: Outpatient Age: 66 Room: [facility] ENDO ROOM 1 Gender: Female Note Status: Finalized Procedure:            Colonoscopy Indications:          Screening for colorectal malignant neoplasm Providers:            Lollie Sails, MD Referring MD:         Ramonita Lab, MD (Referring MD) Medicines:            Monitored Anesthesia Care Complications:        No immediate complications. Procedure:            Pre-Anesthesia Assessment:                       - ASA Grade Assessment: II - A patient with mild                        systemic disease.                       After obtaining informed consent, the colonoscope was                        passed under direct vision. Throughout the procedure,                        the patient's blood pressure, pulse, and oxygen                        saturations were monitored continuously. The                        Colonoscope was introduced through the anus and                        advanced to the the cecum, identified by appendiceal                        orifice and ileocecal valve. The colonoscopy was                        performed without difficulty. The patient tolerated the                        procedure well. The quality of the bowel preparation                        was good. Findings:      Multiple medium-mouthed diverticula were found in the sigmoid colon and       descending colon.      Two sessile polyps were found in the cecum. The polyps were 1 to 7 mm in       size. These polyps were removed with a cold snare and cold forcep.       Resection and retrieval were complete.      The exam was otherwise without abnormality.      The retroflexed view of the distal rectum and anal verge was normal and       showed no anal  or rectal abnormalities.      The  digital rectal exam was normal. Impression:           - Diverticulosis in the sigmoid colon and in the                        descending colon.                       - Two 1 to 7 mm polyps in the cecum, removed with a                        cold snare. Resected and retrieved.                       - The examination was otherwise normal.                       - The distal rectum and anal verge are normal on                        retroflexion view. Recommendation:       - Discharge patient to home.                       - Telephone GI clinic for pathology results in 1 week. Procedure Code(s):    --- Professional ---                       [id number], Colonoscopy, flexible; with removal of tumor(s),                        polyp(s), or other lesion(s) by snare technique Diagnosis Code(s):    --- Professional ---                       Z12.11, Encounter for screening for malignant neoplasm                        of colon                       D12.0, Benign neoplasm of cecum                       K57.30, Diverticulosis of large intestine without                        perforation or abscess without bleeding CPT copyright 2016 American Medical Association. All rights reserved. The codes documented in this report are preliminary and upon coder review may  be revised to meet current compliance requirements. Lollie Sails, MD 03/27/2017 12:20:14 PM This report has been signed electronically. Number of Addenda: 0 Note Initiated On: 03/27/2017 11:49 AM Scope Withdrawal Time: 0 hours 14 minutes 2 seconds  Total Procedure Duration: 0 hours 19 minutes 5 seconds       [facility]

## 2017-03-27 NOTE — Anesthesia Post-op Follow-up Note (Signed)
Anesthesia QCDR form completed.        

## 2017-03-27 NOTE — H&P (Signed)
Outpatient short stay form Pre-procedure 03/27/2017 11:51 AM Lollie Sails MD  Primary Physician: Dr. Ramonita Lab  Reason for visit:  Colonoscopy  History of present illness:  Patient is a 65 year old female presenting today as above. She tolerated her prep well for her screening colonoscopy. She takes no aspirin or blood thinning agents. Last colonoscopy was in 2007 with a finding of diverticulosis. Chart indicates she takes a 81 mg aspirin but has held that.    Current Facility-Administered Medications:  .  0.9 %  sodium chloride infusion, , Intravenous, Continuous, Lollie Sails, MD, Last Rate: 20 mL/hr at 03/27/17 1005 .  0.9 %  sodium chloride infusion, , Intravenous, Continuous, Lollie Sails, MD  Prescriptions Prior to Admission  Medication Sig Dispense Refill Last Dose  . alendronate (FOSAMAX) 70 MG tablet Take by mouth.   Past Week at Unknown time  . anastrozole (ARIMIDEX) 1 MG tablet Take 1 tablet (1 mg total) by mouth daily. 90 tablet 4 Past Week at Unknown time  . Calcium Carbonate-Vitamin D (CALCIUM 600+D) 600-200 MG-UNIT TABS Take by mouth.   Past Week at Unknown time  . Cholecalciferol (VITAMIN D3) 2000 UNITS capsule Take by mouth.   Past Week at Unknown time  . levothyroxine (SYNTHROID, LEVOTHROID) 50 MCG tablet Take by mouth.   Past Week at Unknown time  . Multiple Vitamin (MULTI-VITAMINS) TABS Take by mouth.   Past Week at Unknown time  . Multiple Vitamins-Minerals (ICAPS AREDS 2) CAPS Take 2 capsules by mouth 2 (two) times daily at 8 am and 10 pm.   Past Week at Unknown time  . simvastatin (ZOCOR) 40 MG tablet Take by mouth.   Past Week at Unknown time  . aspirin 81 MG chewable tablet Chew by mouth.   03/24/2017     Allergies  Allergen Reactions  . Formaldehyde Rash     Past Medical History:  Diagnosis Date  . Breast cancer ([place]) 2011   LT LUMPECTOMY WITH RADIATION  . Carcinoma of overlapping sites of left breast in female, estrogen receptor  positive ([place])   . DVT (deep venous thrombosis) ([place])   . DVT of leg (deep venous thrombosis) ([place]) 1970   while on birth control  . History of left breast cancer 03/25/2010   stage 1, clinical infiltrating carcinoma, tumor size 0.9 cm; margins clear, lymph nodes negative, ER/PR positive/HER2 negative  . History of radiation therapy   . History of spinal fracture   . Hyperlipidemia   . Hypertension   . Hypothyroidism   . Macular degeneration, wet ([place])   . Osteoporosis   . Psoriasis     Review of systems:      Physical Exam    Heart and lungs: Regular rate and rhythm without rub or gallop, lungs are bilaterally clear.    HEENT: Normocephalic atraumatic eyes are anicteric    Other:     Pertinant exam for procedure: Soft nontender nondistended bowel sounds positive normoactive.    Planned proceedures: Colonoscopy and indicated procedures. I have discussed the risks benefits and complications of procedures to include not limited to bleeding, infection, perforation and the risk of sedation and the patient wishes to proceed.    Lollie Sails, MD Gastroenterology 03/27/2017  11:51 AM

## 2017-03-27 NOTE — Anesthesia Preprocedure Evaluation (Signed)
Anesthesia Evaluation  Patient identified by MRN, date of birth, ID band Patient awake    Reviewed: Allergy & Precautions, NPO status , Patient's Chart, lab work & pertinent test results  Airway Mallampati: I       Dental  (+) Teeth Intact   Pulmonary neg pulmonary ROS,    breath sounds clear to auscultation       Cardiovascular Exercise Tolerance: Good hypertension, Pt. on medications + Peripheral Vascular Disease   Rhythm:Regular Rate:Normal     Neuro/Psych negative neurological ROS  negative psych ROS   GI/Hepatic negative GI ROS, Neg liver ROS,   Endo/Other  Hypothyroidism   Renal/GU      Musculoskeletal negative musculoskeletal ROS (+)   Abdominal Normal abdominal exam  (+)   Peds negative pediatric ROS (+)  Hematology negative hematology ROS (+)   Anesthesia Other Findings   Reproductive/Obstetrics                             Anesthesia Physical Anesthesia Plan  ASA: II  Anesthesia Plan: General   Post-op Pain Management:    Induction: Intravenous  PONV Risk Score and Plan:   Airway Management Planned: Natural Airway and Nasal Cannula  Additional Equipment:   Intra-op Plan:   Post-operative Plan:   Informed Consent: I have reviewed the patients History and Physical, chart, labs and discussed the procedure including the risks, benefits and alternatives for the proposed anesthesia with the patient or authorized representative who has indicated his/her understanding and acceptance.     Plan Discussed with: CRNA  Anesthesia Plan Comments:         Anesthesia Quick Evaluation

## 2017-03-27 NOTE — Transfer of Care (Signed)
Immediate Anesthesia Transfer of Care Note  Patient: Katelyn Manning  Procedure(s) Performed: Procedure(s): COLONOSCOPY WITH PROPOFOL (N/A)  Patient Location: PACU and Endoscopy Unit  Anesthesia Type:General  Level of Consciousness: sedated  Airway & Oxygen Therapy: Patient Spontanous Breathing and Patient connected to nasal cannula oxygen  Post-op Assessment: Report given to RN and Post -op Vital signs reviewed and stable  Post vital signs: Reviewed and stable  Last Vitals:  Vitals:   03/27/17 0942  BP: (!) 144/66  Pulse: 78  Resp: 20  Temp: 36.7 C  SpO2: 100%    Last Pain:  Vitals:   03/27/17 0942  TempSrc: Tympanic         Complications: No apparent anesthesia complications

## 2017-03-27 NOTE — Anesthesia Postprocedure Evaluation (Signed)
Anesthesia Post Note  Patient: Katelyn Manning  Procedure(s) Performed: Procedure(s) (LRB): COLONOSCOPY WITH PROPOFOL (N/A)  Patient location during evaluation: PACU Anesthesia Type: General Level of consciousness: awake Pain management: pain level controlled Vital Signs Assessment: post-procedure vital signs reviewed and stable Respiratory status: nonlabored ventilation Cardiovascular status: stable Anesthetic complications: no     Last Vitals:  Vitals:   03/27/17 1240 03/27/17 1252  BP: (!) 116/58 137/61  Pulse: 65 67  Resp: (!) 23 15  Temp:    SpO2: 99% 100%    Last Pain:  Vitals:   03/27/17 1222  TempSrc: Tympanic                 VAN STAVEREN,Jaimi Belle

## 2017-03-29 LAB — SURGICAL PATHOLOGY

## 2017-05-12 ENCOUNTER — Ambulatory Visit
Admission: RE | Admit: 2017-05-12 | Discharge: 2017-05-12 | Disposition: A | Discharge status: Home / Self Care | Payer: Medicare Other | Source: Ambulatory Visit | Attending: Internal Medicine | Admitting: Internal Medicine

## 2017-05-12 DIAGNOSIS — Z9889 Other specified postprocedural states: Secondary | ICD-10-CM | POA: Diagnosis not present

## 2017-05-12 DIAGNOSIS — Z853 Personal history of malignant neoplasm of breast: Secondary | ICD-10-CM | POA: Diagnosis present

## 2017-05-12 HISTORY — DX: Personal history of irradiation: Z92.3

## 2017-05-23 ENCOUNTER — Inpatient Hospital Stay: Payer: Medicare Other | Attending: Internal Medicine

## 2017-05-23 ENCOUNTER — Inpatient Hospital Stay: Payer: Medicare Other | Admitting: Internal Medicine

## 2017-06-10 ENCOUNTER — Other Ambulatory Visit: Payer: Self-pay | Admitting: *Deleted

## 2017-06-10 DIAGNOSIS — Z170 Estrogen receptor positive status [ER+]: Principal | ICD-10-CM

## 2017-06-10 DIAGNOSIS — C50812 Malignant neoplasm of overlapping sites of left female breast: Secondary | ICD-10-CM

## 2017-06-11 ENCOUNTER — Inpatient Hospital Stay: Payer: Medicare Other

## 2017-06-11 ENCOUNTER — Inpatient Hospital Stay: Payer: Medicare Other | Attending: Internal Medicine | Admitting: Internal Medicine

## 2017-06-11 VITALS — BP 174/81 | HR 68 | Temp 97.8°F | Resp 20 | Ht 59.0 in | Wt 126.0 lb

## 2017-06-11 DIAGNOSIS — C50812 Malignant neoplasm of overlapping sites of left female breast: Secondary | ICD-10-CM

## 2017-06-11 DIAGNOSIS — Z86718 Personal history of other venous thrombosis and embolism: Secondary | ICD-10-CM | POA: Diagnosis not present

## 2017-06-11 DIAGNOSIS — M810 Age-related osteoporosis without current pathological fracture: Secondary | ICD-10-CM | POA: Diagnosis not present

## 2017-06-11 DIAGNOSIS — Z9223 Personal history of estrogen therapy: Secondary | ICD-10-CM | POA: Diagnosis not present

## 2017-06-11 DIAGNOSIS — E785 Hyperlipidemia, unspecified: Secondary | ICD-10-CM | POA: Diagnosis not present

## 2017-06-11 DIAGNOSIS — Z170 Estrogen receptor positive status [ER+]: Secondary | ICD-10-CM | POA: Diagnosis not present

## 2017-06-11 DIAGNOSIS — E039 Hypothyroidism, unspecified: Secondary | ICD-10-CM | POA: Insufficient documentation

## 2017-06-11 DIAGNOSIS — Z853 Personal history of malignant neoplasm of breast: Secondary | ICD-10-CM | POA: Diagnosis present

## 2017-06-11 DIAGNOSIS — Z923 Personal history of irradiation: Secondary | ICD-10-CM | POA: Insufficient documentation

## 2017-06-11 DIAGNOSIS — L409 Psoriasis, unspecified: Secondary | ICD-10-CM | POA: Insufficient documentation

## 2017-06-11 DIAGNOSIS — Z7982 Long term (current) use of aspirin: Secondary | ICD-10-CM | POA: Diagnosis not present

## 2017-06-11 DIAGNOSIS — I10 Essential (primary) hypertension: Secondary | ICD-10-CM | POA: Diagnosis not present

## 2017-06-11 DIAGNOSIS — Z79899 Other long term (current) drug therapy: Secondary | ICD-10-CM | POA: Diagnosis not present

## 2017-06-11 LAB — CBC WITH DIFFERENTIAL/PLATELET
BASOS ABS: 0.1 10*3/uL (ref 0–0.1)
Basophils Relative: 1 %
EOS ABS: 0.2 10*3/uL (ref 0–0.7)
EOS PCT: 2 %
HCT: 41.1 % (ref 35.0–47.0)
Hemoglobin: 14.2 g/dL (ref 12.0–16.0)
Lymphocytes Relative: 19 %
Lymphs Abs: 1.3 10*3/uL (ref 1.0–3.6)
MCH: 32.7 pg (ref 26.0–34.0)
MCHC: 34.4 g/dL (ref 32.0–36.0)
MCV: 95 fL (ref 80.0–100.0)
Monocytes Absolute: 0.7 10*3/uL (ref 0.2–0.9)
Monocytes Relative: 10 %
Neutro Abs: 4.5 10*3/uL (ref 1.4–6.5)
Neutrophils Relative %: 68 %
PLATELETS: 276 10*3/uL (ref 150–440)
RBC: 4.33 MIL/uL (ref 3.80–5.20)
RDW: 13.6 % (ref 11.5–14.5)
WBC: 6.8 10*3/uL (ref 3.6–11.0)

## 2017-06-11 LAB — COMPREHENSIVE METABOLIC PANEL
ALBUMIN: 3.9 g/dL (ref 3.5–5.0)
ALT: 20 U/L (ref 14–54)
AST: 29 U/L (ref 15–41)
Alkaline Phosphatase: 72 U/L (ref 38–126)
Anion gap: 6 (ref 5–15)
BUN: 14 mg/dL (ref 6–20)
CHLORIDE: 105 mmol/L (ref 101–111)
CO2: 24 mmol/L (ref 22–32)
CREATININE: 0.65 mg/dL (ref 0.44–1.00)
Calcium: 8.8 mg/dL — ABNORMAL LOW (ref 8.9–10.3)
GFR calc Af Amer: >60 mL/min (ref >60)
GFR calc non Af Amer: >60 mL/min (ref >60)
Glucose, Bld: 110 mg/dL — ABNORMAL HIGH (ref 65–99)
POTASSIUM: 4.9 mmol/L (ref 3.5–5.1)
SODIUM: 135 mmol/L (ref 135–145)
Total Bilirubin: 0.6 mg/dL (ref 0.3–1.2)
Total Protein: 7 g/dL (ref 6.5–8.1)

## 2017-06-11 NOTE — Progress Notes (Signed)
Patient here for breast cancer follow-up. She had a recent mammogram-ordered by her pcp. She has medical complaints.  In March 2018, she started Prolia injections. She d/c the fosamax per patient due to starting prolia. She also d/c the Arimidex per advisement of Dr. Rogue Bussing at last visit.  RN Chaperoned provider with Breast Exam.

## 2017-06-11 NOTE — Progress Notes (Signed)
[place] OFFICE PROGRESS NOTE  Patient Care Team: Adin [name], MD as PCP - General (Internal Medicine)   SUMMARY OF ONCOLOGIC HISTORY: Oncology History   # LEFT BREAST CA STAGE I ER/PR POS; Her 2 NEU-NEG s/p Lump & RT; LOW RISK ONCOTYPE- No chemo; NOV 2011- START ARIMIDEX  OCT 2017- BREAST CANCER INDEX -late risk of recurrence- 2.4%; LOW BENEFIT of extended Adjuvant therapy.;Arimidex- STOPPED OCT 2017  # OSTEOPOROSIS- on Fosomax + Vit D & ca ; BMD- STABLE osteporosis      Carcinoma of overlapping sites of left breast in female, estrogen receptor positive ([place])     INTERVAL HISTORY:  A pleasant 66 year old female patient with above history of stage I breast cancer currently on surveillance is here for follow-up.   Patient stopped her Arimidex 1 year ago when she finished 5 years.  Patient denies any new lumps or bumps. Appetite is good. No bone pain. No joint pains. No hot flashes. No shortness of breath or cough or chest pain.  REVIEW OF SYSTEMS:  A complete 10 point review of system is done which is negative except mentioned above/history of present illness.   PAST MEDICAL HISTORY :  Past Medical History:  Diagnosis Date  . 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 Carcinoma of overlapping sites of left breast in female, estrogen receptor positive ([place])   . DVT (deep venous thrombosis) ([place])   . DVT of leg (deep venous thrombosis) ([place]) 1970   while on birth control  . History of left breast cancer 03/25/2010   stage 1, clinical infiltrating carcinoma, tumor size 0.9 cm; margins clear, lymph nodes negative, ER/PR positive/HER2 negative  . History of radiation therapy   . History of spinal fracture   . Hyperlipidemia   . Hypertension   . Hypothyroidism   . Macular degeneration, wet ([place])   . Osteoporosis   . Personal history of radiation therapy 2011   left breast  . Psoriasis     PAST SURGICAL HISTORY :    Past Surgical History:  Procedure Laterality Date  . BREAST BIOPSY Left 03/08/2010   Tubular carcinoma  . BREAST LUMPECTOMY Left 03/28/2010   tubular carcinoma and 2 benign LN  . FOOT SURGERY  1999  . TUBAL LIGATION      FAMILY HISTORY :   Family History  Problem Relation Age of Onset  . Breast cancer Maternal Aunt 70  . Diabetes Unknown   . Hypertension Unknown   . Stroke Unknown   . Diabetes Brother   . Heart disease Brother   . Heart attack Brother     SOCIAL HISTORY:   Social History   Tobacco Use  . Smoking status: Never Smoker  . Smokeless tobacco: Never Used  Substance Use Topics  . Alcohol use: Yes    Alcohol/week: 0.0 oz    Comment: occasional alcohol use 2-3 times a week  . Drug use: No    ALLERGIES:  is allergic to formaldehyde.  MEDICATIONS:  Current Outpatient Medications  Medication Sig Dispense Refill  . aspirin 81 MG chewable tablet Chew 81 mg by mouth daily.     . Calcium Carbonate-Vitamin D (CALCIUM 600+D) 600-200 MG-UNIT TABS Take 1 tablet by mouth daily.     . Cholecalciferol (VITAMIN D3) 2000 UNITS capsule Take 2,000 Units by mouth daily.     Marland Kitchen denosumab (PROLIA) 60 MG/ML SOLN injection Inject 60 mg into the skin every  6 (six) months. Administer in upper arm, thigh, or abdomen    . levothyroxine (SYNTHROID, LEVOTHROID) 50 MCG tablet Take 50 mcg by mouth daily before breakfast.     . Multiple Vitamin (MULTI-VITAMINS) TABS Take 1 tablet by mouth daily.     . Multiple Vitamins-Minerals (ICAPS AREDS 2) CAPS Take 2 capsules by mouth 2 (two) times daily at 8 am and 10 pm.    . simvastatin (ZOCOR) 40 MG tablet Take 40 mg by mouth daily at 6 PM.      No current facility-administered medications for this visit.     PHYSICAL EXAMINATION: ECOG PERFORMANCE STATUS: 0 - Asymptomatic  BP (!) 174/81 (BP Location: Right Arm, Patient Position: Sitting)   Pulse 68   Temp 97.8 F (36.6 C) (Tympanic)   Resp 20   Ht '4\' 11"'$  (1.499 m)   Wt 126 lb (57.2 kg)    BMI 25.45 kg/m   Filed Weights   06/11/17 1034  Weight: 126 lb (57.2 kg)    GENERAL: Well-nourished well-developed; Alert, no distress and comfortable.   Alone. EYES: no pallor or icterus OROPHARYNX: no thrush or ulceration; good dentition  NECK: supple, no masses felt LYMPH:  no palpable lymphadenopathy in the cervical, axillary or inguinal regions LUNGS: clear to auscultation and  No wheeze or crackles HEART/CVS: regular rate & rhythm and no murmurs; No lower extremity edema ABDOMEN:abdomen soft, non-tender and normal bowel sounds Musculoskeletal:no cyanosis of digits and no clubbing  PSYCH: alert & oriented x 3 with fluent speech NEURO: no focal motor/sensory deficits SKIN:  no rashes or significant lesions Breast exam- right breast- no nipple changes or skin changes. No lumps felt. Left breast-lumpectomy scar noted; no masses felt no other skin changes noted.   LABORATORY DATA:  I have reviewed the data as listed    Component Value Date/Time   NA 135 06/11/2017 1005   K 4.9 06/11/2017 1005   CL 105 06/11/2017 1005   CO2 24 06/11/2017 1005   GLUCOSE 110 (H) 06/11/2017 1005   BUN 14 06/11/2017 1005   CREATININE 0.65 06/11/2017 1005   CREATININE 0.77 05/11/2014 1030   CALCIUM 8.8 (L) 06/11/2017 1005   PROT 7.0 06/11/2017 1005   PROT 7.5 05/11/2014 1030   ALBUMIN 3.9 06/11/2017 1005   ALBUMIN 3.9 05/11/2014 1030   AST 29 06/11/2017 1005   AST 25 05/11/2014 1030   ALT 20 06/11/2017 1005   ALT 26 05/11/2014 1030   ALKPHOS 72 06/11/2017 1005   ALKPHOS 73 05/11/2014 1030   BILITOT 0.6 06/11/2017 1005   BILITOT 0.3 05/11/2014 1030   GFRNONAA >60 06/11/2017 1005   GFRNONAA >60 05/11/2014 1030   GFRNONAA >60 05/10/2013 1042   GFRAA >60 06/11/2017 1005   GFRAA >60 05/11/2014 1030   GFRAA >60 05/10/2013 1042    No results found for: SPEP, UPEP  Lab Results  Component Value Date   WBC 6.8 06/11/2017   NEUTROABS 4.5 06/11/2017   HGB 14.2 06/11/2017   HCT 41.1  06/11/2017   MCV 95.0 06/11/2017   PLT 276 06/11/2017      Chemistry      Component Value Date/Time   NA 135 06/11/2017 1005   K 4.9 06/11/2017 1005   CL 105 06/11/2017 1005   CO2 24 06/11/2017 1005   BUN 14 06/11/2017 1005   CREATININE 0.65 06/11/2017 1005   CREATININE 0.77 05/11/2014 1030      Component Value Date/Time   CALCIUM 8.8 (L) 06/11/2017 1005  ALKPHOS 72 06/11/2017 1005   ALKPHOS 73 05/11/2014 1030   AST 29 06/11/2017 1005   AST 25 05/11/2014 1030   ALT 20 06/11/2017 1005   ALT 26 05/11/2014 1030   BILITOT 0.6 06/11/2017 1005   BILITOT 0.3 05/11/2014 1030       RADIOGRAPHIC STUDIES: I have personally reviewed the radiological images as listed and agreed with the findings in the report. No results found.   ASSESSMENT & PLAN:   Carcinoma of overlapping sites of left breast in female, estrogen receptor positive ([place]) # Stage I ER/PR positive HER-2/neu negative breast cancer low risk Oncotype; LOW BCI- discontinued Arimidex- OCT 2017. Mammogram -oct 2018-WNL.   # Osteoporosis - on prolia as per PCP; on calcium and vitamin D/ with PCP   # follow up in 12 months/no labs.   No orders of the defined types were placed in this encounter.      Cammie Sickle, MD 06/15/2017 5:55 PM

## 2017-06-11 NOTE — Assessment & Plan Note (Addendum)
#  Stage I ER/PR positive HER-2/neu negative breast cancer low risk Oncotype; LOW BCI- discontinued Arimidex- OCT 2017. Mammogram -oct 2018-WNL. Clinically no evidence of recurrence.  # Osteoporosis - on prolia as per PCP; on calcium and vitamin D/ with PCP  # follow up in 12 months/no labs.  

## 2018-06-11 ENCOUNTER — Inpatient Hospital Stay: Payer: Medicare Other | Admitting: Internal Medicine

## 2018-06-11 NOTE — Assessment & Plan Note (Deleted)
#  Stage I ER/PR positive HER-2/neu negative breast cancer low risk Oncotype; LOW BCI- discontinued Arimidex- OCT 2017. Mammogram -oct 2018-WNL. Clinically no evidence of recurrence.  # Osteoporosis - on prolia as per PCP; on calcium and vitamin D/ with PCP  # follow up in 12 months/no labs.

## 2018-06-11 NOTE — Progress Notes (Deleted)
[place] OFFICE PROGRESS NOTE  Patient Care Team: Adin [name], MD as PCP - General (Internal Medicine)   SUMMARY OF ONCOLOGIC HISTORY: Oncology History   # LEFT BREAST CA STAGE I ER/PR POS; Her 2 NEU-NEG s/p Lump & RT; LOW RISK ONCOTYPE- No chemo; NOV 2011- START ARIMIDEX  OCT 2017- BREAST CANCER INDEX -late risk of recurrence- 2.4%; LOW BENEFIT of extended Adjuvant therapy.;Arimidex- STOPPED OCT 2017  # OSTEOPOROSIS- on Fosomax + Vit D & ca ; BMD- STABLE osteporosis      Carcinoma of overlapping sites of left breast in female, estrogen receptor positive ([place])     INTERVAL HISTORY:  A pleasant 67 year old female patient with above history of stage I breast cancer currently on surveillance is here for follow-up.   Patient stopped her Arimidex 1 year ago when she finished 5 years.  Patient denies any new lumps or bumps. Appetite is good. No bone pain. No joint pains. No hot flashes. No shortness of breath or cough or chest pain.  REVIEW OF SYSTEMS:  A complete 10 point review of system is done which is negative except mentioned above/history of present illness.   PAST MEDICAL HISTORY :  Past Medical History:  Diagnosis Date  . 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 Carcinoma of overlapping sites of left breast in female, estrogen receptor positive ([place])   . DVT (deep venous thrombosis) ([place])   . DVT of leg (deep venous thrombosis) ([place]) 1970   while on birth control  . History of left breast cancer 03/25/2010   stage 1, clinical infiltrating carcinoma, tumor size 0.9 cm; margins clear, lymph nodes negative, ER/PR positive/HER2 negative  . History of radiation therapy   . History of spinal fracture   . Hyperlipidemia   . Hypertension   . Hypothyroidism   . Macular degeneration, wet ([place])   . Osteoporosis   . Personal history of radiation therapy 2011   left breast  . Psoriasis     PAST SURGICAL HISTORY :    Past Surgical History:  Procedure Laterality Date  . BREAST BIOPSY Left 03/08/2010   Tubular carcinoma  . BREAST LUMPECTOMY Left 03/28/2010   tubular carcinoma and 2 benign LN  . COLONOSCOPY WITH PROPOFOL N/A 03/27/2017   Procedure: COLONOSCOPY WITH PROPOFOL;  Surgeon: Lollie Sails, MD;  Location: [facility] ENDOSCOPY;  Service: Endoscopy;  Laterality: N/A;  . FOOT SURGERY  1999  . TUBAL LIGATION      FAMILY HISTORY :   Family History  Problem Relation Age of Onset  . Breast cancer Maternal Aunt 70  . Diabetes Unknown   . Hypertension Unknown   . Stroke Unknown   . Diabetes Brother   . Heart disease Brother   . Heart attack Brother     SOCIAL HISTORY:   Social History   Tobacco Use  . Smoking status: Never Smoker  . Smokeless tobacco: Never Used  Substance Use Topics  . Alcohol use: Yes    Alcohol/week: 0.0 standard drinks    Comment: occasional alcohol use 2-3 times a week  . Drug use: No    ALLERGIES:  is allergic to formaldehyde.  MEDICATIONS:  Current Outpatient Medications  Medication Sig Dispense Refill  . aspirin 81 MG chewable tablet Chew 81 mg by mouth daily.     . Calcium Carbonate-Vitamin D (CALCIUM 600+D) 600-200 MG-UNIT TABS Take 1 tablet by mouth daily.     Marland Kitchen  Cholecalciferol (VITAMIN D3) 2000 UNITS capsule Take 2,000 Units by mouth daily.     Marland Kitchen denosumab (PROLIA) 60 MG/ML SOLN injection Inject 60 mg into the skin every 6 (six) months. Administer in upper arm, thigh, or abdomen    . levothyroxine (SYNTHROID, LEVOTHROID) 50 MCG tablet Take 50 mcg by mouth daily before breakfast.     . Multiple Vitamin (MULTI-VITAMINS) TABS Take 1 tablet by mouth daily.     . Multiple Vitamins-Minerals (ICAPS AREDS 2) CAPS Take 2 capsules by mouth 2 (two) times daily at 8 am and 10 pm.    . simvastatin (ZOCOR) 40 MG tablet Take 40 mg by mouth daily at 6 PM.      No current facility-administered medications for this visit.     PHYSICAL EXAMINATION: ECOG PERFORMANCE  STATUS: 0 - Asymptomatic  There were no vitals taken for this visit.  There were no vitals filed for this visit.  GENERAL: Well-nourished well-developed; Alert, no distress and comfortable.   Alone. EYES: no pallor or icterus OROPHARYNX: no thrush or ulceration; good dentition  NECK: supple, no masses felt LYMPH:  no palpable lymphadenopathy in the cervical, axillary or inguinal regions LUNGS: clear to auscultation and  No wheeze or crackles HEART/CVS: regular rate & rhythm and no murmurs; No lower extremity edema ABDOMEN:abdomen soft, non-tender and normal bowel sounds Musculoskeletal:no cyanosis of digits and no clubbing  PSYCH: alert & oriented x 3 with fluent speech NEURO: no focal motor/sensory deficits SKIN:  no rashes or significant lesions Breast exam- right breast- no nipple changes or skin changes. No lumps felt. Left breast-lumpectomy scar noted; no masses felt no other skin changes noted.   LABORATORY DATA:  I have reviewed the data as listed    Component Value Date/Time   NA 135 06/11/2017 1005   K 4.9 06/11/2017 1005   CL 105 06/11/2017 1005   CO2 24 06/11/2017 1005   GLUCOSE 110 (H) 06/11/2017 1005   BUN 14 06/11/2017 1005   CREATININE 0.65 06/11/2017 1005   CREATININE 0.77 05/11/2014 1030   CALCIUM 8.8 (L) 06/11/2017 1005   PROT 7.0 06/11/2017 1005   PROT 7.5 05/11/2014 1030   ALBUMIN 3.9 06/11/2017 1005   ALBUMIN 3.9 05/11/2014 1030   AST 29 06/11/2017 1005   AST 25 05/11/2014 1030   ALT 20 06/11/2017 1005   ALT 26 05/11/2014 1030   ALKPHOS 72 06/11/2017 1005   ALKPHOS 73 05/11/2014 1030   BILITOT 0.6 06/11/2017 1005   BILITOT 0.3 05/11/2014 1030   GFRNONAA >60 06/11/2017 1005   GFRNONAA >60 05/11/2014 1030   GFRNONAA >60 05/10/2013 1042   GFRAA >60 06/11/2017 1005   GFRAA >60 05/11/2014 1030   GFRAA >60 05/10/2013 1042    No results found for: SPEP, UPEP  Lab Results  Component Value Date   WBC 6.8 06/11/2017   NEUTROABS 4.5 06/11/2017    HGB 14.2 06/11/2017   HCT 41.1 06/11/2017   MCV 95.0 06/11/2017   PLT 276 06/11/2017      Chemistry      Component Value Date/Time   NA 135 06/11/2017 1005   K 4.9 06/11/2017 1005   CL 105 06/11/2017 1005   CO2 24 06/11/2017 1005   BUN 14 06/11/2017 1005   CREATININE 0.65 06/11/2017 1005   CREATININE 0.77 05/11/2014 1030      Component Value Date/Time   CALCIUM 8.8 (L) 06/11/2017 1005   ALKPHOS 72 06/11/2017 1005   ALKPHOS 73 05/11/2014 1030   AST  29 06/11/2017 1005   AST 25 05/11/2014 1030   ALT 20 06/11/2017 1005   ALT 26 05/11/2014 1030   BILITOT 0.6 06/11/2017 1005   BILITOT 0.3 05/11/2014 1030       RADIOGRAPHIC STUDIES: I have personally reviewed the radiological images as listed and agreed with the findings in the report. No results found.   ASSESSMENT & PLAN:   No problem-specific Assessment & Plan notes found for this encounter.  No orders of the defined types were placed in this encounter.      Cammie Sickle, MD 06/11/2018 8:12 AM

## 2018-06-26 ENCOUNTER — Other Ambulatory Visit: Payer: Self-pay | Admitting: Internal Medicine

## 2018-06-26 DIAGNOSIS — Z1231 Encounter for screening mammogram for malignant neoplasm of breast: Secondary | ICD-10-CM

## 2018-07-01 ENCOUNTER — Other Ambulatory Visit: Payer: Self-pay

## 2018-07-01 ENCOUNTER — Encounter: Payer: Self-pay | Admitting: Internal Medicine

## 2018-07-01 ENCOUNTER — Inpatient Hospital Stay: Payer: Medicare Other | Attending: Internal Medicine | Admitting: Internal Medicine

## 2018-07-01 VITALS — BP 156/83 | HR 68 | Temp 98.0°F | Resp 18 | Ht 59.0 in | Wt 132.0 lb

## 2018-07-01 DIAGNOSIS — Z853 Personal history of malignant neoplasm of breast: Secondary | ICD-10-CM

## 2018-07-01 DIAGNOSIS — M818 Other osteoporosis without current pathological fracture: Secondary | ICD-10-CM | POA: Insufficient documentation

## 2018-07-01 DIAGNOSIS — Z86718 Personal history of other venous thrombosis and embolism: Secondary | ICD-10-CM | POA: Insufficient documentation

## 2018-07-01 DIAGNOSIS — E039 Hypothyroidism, unspecified: Secondary | ICD-10-CM | POA: Diagnosis not present

## 2018-07-01 DIAGNOSIS — C50812 Malignant neoplasm of overlapping sites of left female breast: Secondary | ICD-10-CM

## 2018-07-01 DIAGNOSIS — Z170 Estrogen receptor positive status [ER+]: Secondary | ICD-10-CM

## 2018-07-01 NOTE — Assessment & Plan Note (Addendum)
#  Stage I ER/PR positive HER-2/neu negative breast cancer low risk Oncotype; LOW BCI- discontinued Arimidex- OCT 2017.  #Clinically stable.  No evidence of recurrence.  Pending mammogram December 2019.  # Osteoporosis - on prolia as per PCP/endocrinology; on calcium and vitamin.  Stable  #Discussed regarding follow-up; wants to follow-up in 1 more year.  # DISPOSITION:  # follow up in 12 months/no labs [1 more year-pt pref].

## 2018-07-01 NOTE — Progress Notes (Signed)
[place] OFFICE PROGRESS NOTE  Patient Care Team: Adin [name], MD as PCP - General (Internal Medicine)   SUMMARY OF ONCOLOGIC HISTORY: Oncology History   # LEFT BREAST CA STAGE I ER/PR POS; Her 2 NEU-NEG s/p Lump & RT; LOW RISK ONCOTYPE- No chemo; NOV 2011- START ARIMIDEX  OCT 2017- BREAST CANCER INDEX -late risk of recurrence- 2.4%; LOW BENEFIT of extended Adjuvant therapy.;Arimidex- STOPPED OCT 2017  # OSTEOPOROSIS- on Fosomax + Vit D & ca ; BMD- STABLE osteporosis '[]'$  on prolia-Dr.Solum  DIAGNOSIS: Breast cancer left  STAGE: I        ;GOALS: cure  CURRENT/MOST RECENT THERAPY: surveillaince      Carcinoma of overlapping sites of left breast in female, estrogen receptor positive ([place])     INTERVAL HISTORY:  A pleasant 67year-old female patient with above history of stage I breast cancer currently on surveillance is here for follow-up.   She denies any new lumps or bumps.  Appetite is good but no weight loss but no bone pain.  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  Review of Systems  Constitutional: Negative for chills, diaphoresis, fever, malaise/fatigue and weight loss.  HENT: Negative for nosebleeds and sore throat.   Eyes: Negative for double vision.  Respiratory: Negative for cough, hemoptysis, sputum production, shortness of breath and wheezing.   Cardiovascular: Negative for chest pain, palpitations, orthopnea and leg swelling.  Gastrointestinal: Negative for abdominal pain, blood in stool, constipation, diarrhea, heartburn, melena, nausea and vomiting.  Genitourinary: Negative for dysuria, frequency and urgency.  Musculoskeletal: Negative for back pain and joint pain.  Skin: Negative.  Negative for itching and rash.  Neurological: Negative for dizziness, tingling, focal weakness, weakness and headaches.  Endo/Heme/Allergies: Does not bruise/bleed easily.  Psychiatric/Behavioral: Negative for depression. The patient is  not nervous/anxious and does not have insomnia.      PAST MEDICAL HISTORY :  Past Medical History:  Diagnosis Date  . Breast cancer ([place]) 2011   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  . Carcinoma of overlapping sites of left breast in female, estrogen receptor positive ([place])   . DVT (deep venous thrombosis) ([place])   . DVT of leg (deep venous thrombosis) ([place]) 1970   while on birth control  . History of left breast cancer 03/25/2010   stage 1, clinical infiltrating carcinoma, tumor size 0.9 cm; margins clear, lymph nodes negative, ER/PR positive/HER2 negative  . History of radiation therapy   . History of spinal fracture   . Hyperlipidemia   . Hypertension   . Hypothyroidism   . Macular degeneration, wet ([place])   . Osteoporosis   . Personal history of radiation therapy 2011   left breast  . Psoriasis     PAST SURGICAL HISTORY :   Past Surgical History:  Procedure Laterality Date  . BREAST BIOPSY Left 03/08/2010   Tubular carcinoma  . BREAST LUMPECTOMY Left 03/28/2010   tubular carcinoma and 2 benign LN  . COLONOSCOPY WITH PROPOFOL N/A 03/27/2017   Procedure: COLONOSCOPY WITH PROPOFOL;  Surgeon: Lollie Sails, MD;  Location: [facility] ENDOSCOPY;  Service: Endoscopy;  Laterality: N/A;  . FOOT SURGERY  1999  . TUBAL LIGATION      FAMILY HISTORY :   Family History  Problem Relation Age of Onset  . Breast cancer Maternal Aunt 70  . Diabetes Unknown   . Hypertension Unknown   . Stroke Unknown   . Diabetes Brother   . Heart  disease Brother   . Heart attack Brother     SOCIAL HISTORY:   Social History   Tobacco Use  . Smoking status: Never Smoker  . Smokeless tobacco: Never Used  Substance Use Topics  . Alcohol use: Yes    Alcohol/week: 0.0 standard drinks    Comment: occasional alcohol use 2-3 times a week  . Drug use: No    ALLERGIES:  is allergic to formaldehyde.  MEDICATIONS:  Current Outpatient Medications  Medication Sig Dispense Refill  .  aspirin 81 MG chewable tablet Chew 81 mg by mouth daily.     . Calcium Carbonate-Vitamin D (CALCIUM 600+D) 600-200 MG-UNIT TABS Take 1 tablet by mouth daily.     . Cholecalciferol (VITAMIN D3) 2000 UNITS capsule Take 2,000 Units by mouth daily.     Marland Kitchen denosumab (PROLIA) 60 MG/ML SOLN injection Inject 60 mg into the skin every 6 (six) months. Administer in upper arm, thigh, or abdomen    . levothyroxine (SYNTHROID, LEVOTHROID) 50 MCG tablet Take 50 mcg by mouth daily before breakfast.     . Multiple Vitamin (MULTI-VITAMINS) TABS Take 1 tablet by mouth daily.     . Multiple Vitamins-Minerals (ICAPS AREDS 2) CAPS Take 2 capsules by mouth 2 (two) times daily at 8 am and 10 pm.    . simvastatin (ZOCOR) 40 MG tablet Take 40 mg by mouth daily at 6 PM.      No current facility-administered medications for this visit.     PHYSICAL EXAMINATION: ECOG PERFORMANCE STATUS: 0 - Asymptomatic  BP (!) 156/83   Pulse 68   Temp 98 F (36.7 C) (Oral)   Resp 18   Ht '4\' 11"'$  (1.499 m)   Wt 132 lb (59.9 kg)   BMI 26.66 kg/m   Filed Weights   07/01/18 1023  Weight: 132 lb (59.9 kg)    Physical Exam  Constitutional: She is oriented to person, place, and time and well-developed, well-nourished, and in no distress.  HENT:  Head: Normocephalic and atraumatic.  Mouth/Throat: Oropharynx is clear and moist. No oropharyngeal exudate.  Eyes: Pupils are equal, round, and reactive to light.  Neck: Normal range of motion. Neck supple.  Cardiovascular: Normal rate and regular rhythm.  Pulmonary/Chest: No respiratory distress. She has no wheezes.  Abdominal: Soft. Bowel sounds are normal. She exhibits no distension and no mass. There is no tenderness. There is no rebound and no guarding.  Musculoskeletal: Normal range of motion. She exhibits no edema or tenderness.  Neurological: She is alert and oriented to person, place, and time.  Skin: Skin is warm.  Right and left BREAST exam (in the presence of nurse)- no  unusual skin changes or dominant masses felt. Surgical scars noted.    Psychiatric: Affect normal.      LABORATORY DATA:  I have reviewed the data as listed    Component Value Date/Time   NA 135 06/11/2017 1005   K 4.9 06/11/2017 1005   CL 105 06/11/2017 1005   CO2 24 06/11/2017 1005   GLUCOSE 110 (H) 06/11/2017 1005   BUN 14 06/11/2017 1005   CREATININE 0.65 06/11/2017 1005   CREATININE 0.77 05/11/2014 1030   CALCIUM 8.8 (L) 06/11/2017 1005   PROT 7.0 06/11/2017 1005   PROT 7.5 05/11/2014 1030   ALBUMIN 3.9 06/11/2017 1005   ALBUMIN 3.9 05/11/2014 1030   AST 29 06/11/2017 1005   AST 25 05/11/2014 1030   ALT 20 06/11/2017 1005   ALT 26 05/11/2014 1030  ALKPHOS 72 06/11/2017 1005   ALKPHOS 73 05/11/2014 1030   BILITOT 0.6 06/11/2017 1005   BILITOT 0.3 05/11/2014 1030   GFRNONAA >60 06/11/2017 1005   GFRNONAA >60 05/11/2014 1030   GFRNONAA >60 05/10/2013 1042   GFRAA >60 06/11/2017 1005   GFRAA >60 05/11/2014 1030   GFRAA >60 05/10/2013 1042    No results found for: SPEP, UPEP  Lab Results  Component Value Date   WBC 6.8 06/11/2017   NEUTROABS 4.5 06/11/2017   HGB 14.2 06/11/2017   HCT 41.1 06/11/2017   MCV 95.0 06/11/2017   PLT 276 06/11/2017      Chemistry      Component Value Date/Time   NA 135 06/11/2017 1005   K 4.9 06/11/2017 1005   CL 105 06/11/2017 1005   CO2 24 06/11/2017 1005   BUN 14 06/11/2017 1005   CREATININE 0.65 06/11/2017 1005   CREATININE 0.77 05/11/2014 1030      Component Value Date/Time   CALCIUM 8.8 (L) 06/11/2017 1005   ALKPHOS 72 06/11/2017 1005   ALKPHOS 73 05/11/2014 1030   AST 29 06/11/2017 1005   AST 25 05/11/2014 1030   ALT 20 06/11/2017 1005   ALT 26 05/11/2014 1030   BILITOT 0.6 06/11/2017 1005   BILITOT 0.3 05/11/2014 1030       RADIOGRAPHIC STUDIES: I have personally reviewed the radiological images as listed and agreed with the findings in the report. No results found.   ASSESSMENT & PLAN:   Carcinoma  of overlapping sites of left breast in female, estrogen receptor positive ([place]) # Stage I ER/PR positive HER-2/neu negative breast cancer low risk Oncotype; LOW BCI- discontinued Arimidex- OCT 2017.  #Clinically stable.  No evidence of recurrence.  Pending mammogram December 2019.  # Osteoporosis - on prolia as per PCP/endocrinology; on calcium and vitamin.  Stable  #Discussed regarding follow-up; wants to follow-up in 1 more year.  # DISPOSITION:  # follow up in 12 months/no labs [1 more year-pt pref].   No orders of the defined types were placed in this encounter.      Cammie Sickle, MD 07/01/2018 10:48 AM

## 2018-07-24 ENCOUNTER — Ambulatory Visit
Admission: RE | Admit: 2018-07-24 | Discharge: 2018-07-24 | Disposition: A | Discharge status: Home / Self Care | Payer: Medicare Other | Source: Ambulatory Visit | Attending: Internal Medicine | Admitting: Internal Medicine

## 2018-07-24 DIAGNOSIS — Z1231 Encounter for screening mammogram for malignant neoplasm of breast: Secondary | ICD-10-CM | POA: Diagnosis not present

## 2019-03-24 ENCOUNTER — Inpatient Hospital Stay
Admission: EM | Admit: 2019-03-24 | Discharge: 2019-03-26 | DRG: 247 | Disposition: A | Discharge status: Home / Self Care | Payer: Medicare Other | Attending: Internal Medicine | Admitting: Internal Medicine

## 2019-03-24 ENCOUNTER — Other Ambulatory Visit: Payer: Self-pay | Admitting: Infectious Diseases

## 2019-03-24 ENCOUNTER — Ambulatory Visit
Admission: RE | Admit: 2019-03-24 | Discharge: 2019-03-24 | Disposition: A | Discharge status: Home / Self Care | Payer: Medicare Other | Source: Ambulatory Visit | Attending: Infectious Diseases | Admitting: Infectious Diseases

## 2019-03-24 ENCOUNTER — Other Ambulatory Visit: Payer: Self-pay

## 2019-03-24 ENCOUNTER — Emergency Department: Payer: Medicare Other

## 2019-03-24 ENCOUNTER — Encounter: Payer: Self-pay | Admitting: Emergency Medicine

## 2019-03-24 ENCOUNTER — Other Ambulatory Visit
Admission: RE | Admit: 2019-03-24 | Discharge: 2019-03-24 | Disposition: A | Discharge status: Home / Self Care | Payer: Medicare Other | Source: Ambulatory Visit | Attending: Cardiovascular Disease | Admitting: Cardiovascular Disease

## 2019-03-24 DIAGNOSIS — Z823 Family history of stroke: Secondary | ICD-10-CM | POA: Diagnosis not present

## 2019-03-24 DIAGNOSIS — Z7982 Long term (current) use of aspirin: Secondary | ICD-10-CM

## 2019-03-24 DIAGNOSIS — Z79899 Other long term (current) drug therapy: Secondary | ICD-10-CM | POA: Diagnosis not present

## 2019-03-24 DIAGNOSIS — Z8249 Family history of ischemic heart disease and other diseases of the circulatory system: Secondary | ICD-10-CM

## 2019-03-24 DIAGNOSIS — K219 Gastro-esophageal reflux disease without esophagitis: Secondary | ICD-10-CM | POA: Diagnosis present

## 2019-03-24 DIAGNOSIS — I2510 Atherosclerotic heart disease of native coronary artery without angina pectoris: Secondary | ICD-10-CM | POA: Diagnosis present

## 2019-03-24 DIAGNOSIS — Z923 Personal history of irradiation: Secondary | ICD-10-CM

## 2019-03-24 DIAGNOSIS — I214 Non-ST elevation (NSTEMI) myocardial infarction: Secondary | ICD-10-CM | POA: Diagnosis present

## 2019-03-24 DIAGNOSIS — Z79890 Hormone replacement therapy (postmenopausal): Secondary | ICD-10-CM | POA: Diagnosis not present

## 2019-03-24 DIAGNOSIS — R142 Eructation: Secondary | ICD-10-CM

## 2019-03-24 DIAGNOSIS — E785 Hyperlipidemia, unspecified: Secondary | ICD-10-CM | POA: Diagnosis present

## 2019-03-24 DIAGNOSIS — Z888 Allergy status to other drugs, medicaments and biological substances status: Secondary | ICD-10-CM

## 2019-03-24 DIAGNOSIS — M810 Age-related osteoporosis without current pathological fracture: Secondary | ICD-10-CM | POA: Diagnosis present

## 2019-03-24 DIAGNOSIS — R109 Unspecified abdominal pain: Secondary | ICD-10-CM

## 2019-03-24 DIAGNOSIS — H353290 Exudative age-related macular degeneration, unspecified eye, stage unspecified: Secondary | ICD-10-CM | POA: Diagnosis present

## 2019-03-24 DIAGNOSIS — R079 Chest pain, unspecified: Secondary | ICD-10-CM | POA: Diagnosis present

## 2019-03-24 DIAGNOSIS — Z86718 Personal history of other venous thrombosis and embolism: Secondary | ICD-10-CM

## 2019-03-24 DIAGNOSIS — Z833 Family history of diabetes mellitus: Secondary | ICD-10-CM

## 2019-03-24 DIAGNOSIS — Z170 Estrogen receptor positive status [ER+]: Secondary | ICD-10-CM | POA: Diagnosis not present

## 2019-03-24 DIAGNOSIS — C50912 Malignant neoplasm of unspecified site of left female breast: Secondary | ICD-10-CM

## 2019-03-24 DIAGNOSIS — Z20828 Contact with and (suspected) exposure to other viral communicable diseases: Secondary | ICD-10-CM | POA: Diagnosis present

## 2019-03-24 DIAGNOSIS — E039 Hypothyroidism, unspecified: Secondary | ICD-10-CM | POA: Diagnosis present

## 2019-03-24 DIAGNOSIS — I10 Essential (primary) hypertension: Secondary | ICD-10-CM | POA: Diagnosis present

## 2019-03-24 DIAGNOSIS — Z803 Family history of malignant neoplasm of breast: Secondary | ICD-10-CM

## 2019-03-24 DIAGNOSIS — Z853 Personal history of malignant neoplasm of breast: Secondary | ICD-10-CM

## 2019-03-24 LAB — CBC WITH DIFFERENTIAL/PLATELET
Abs Immature Granulocytes: 0.04 10*3/uL (ref 0.00–0.07)
Basophils Absolute: 0.1 10*3/uL (ref 0.0–0.1)
Basophils Relative: 1 %
Eosinophils Absolute: 0.1 10*3/uL (ref 0.0–0.5)
Eosinophils Relative: 1 %
HCT: 43.7 % (ref 36.0–46.0)
Hemoglobin: 14.3 g/dL (ref 12.0–15.0)
Immature Granulocytes: 0 %
Lymphocytes Relative: 16 %
Lymphs Abs: 1.6 10*3/uL (ref 0.7–4.0)
MCH: 31.3 pg (ref 26.0–34.0)
MCHC: 32.7 g/dL (ref 30.0–36.0)
MCV: 95.6 fL (ref 80.0–100.0)
Monocytes Absolute: 0.9 10*3/uL (ref 0.1–1.0)
Monocytes Relative: 9 %
Neutro Abs: 7.3 10*3/uL (ref 1.7–7.7)
Neutrophils Relative %: 73 %
Platelets: 217 10*3/uL (ref 150–400)
RBC: 4.57 MIL/uL (ref 3.87–5.11)
RDW: 13 % (ref 11.5–15.5)
WBC: 9.9 10*3/uL (ref 4.0–10.5)
nRBC: 0 % (ref 0.0–0.2)

## 2019-03-24 LAB — COMPREHENSIVE METABOLIC PANEL
ALT: 20 U/L (ref 0–44)
ALT: 19 U/L (ref 0–44)
AST: 48 U/L — ABNORMAL HIGH (ref 15–41)
AST: 57 U/L — ABNORMAL HIGH (ref 15–41)
Albumin: 3.7 g/dL (ref 3.5–5.0)
Albumin: 2.8 g/dL — ABNORMAL LOW (ref 3.5–5.0)
Alkaline Phosphatase: 70 U/L (ref 38–126)
Alkaline Phosphatase: 59 U/L (ref 38–126)
Anion gap: 8 (ref 5–15)
Anion gap: 9 (ref 5–15)
BUN: 14 mg/dL (ref 8–23)
BUN: 12 mg/dL (ref 8–23)
CO2: 26 mmol/L (ref 22–32)
CO2: 22 mmol/L (ref 22–32)
Calcium: 9.6 mg/dL (ref 8.9–10.3)
Calcium: 8.5 mg/dL — ABNORMAL LOW (ref 8.9–10.3)
Chloride: 100 mmol/L (ref 98–111)
Chloride: 106 mmol/L (ref 98–111)
Creatinine, Ser: 0.63 mg/dL (ref 0.44–1.00)
Creatinine, Ser: 0.57 mg/dL (ref 0.44–1.00)
GFR calc Af Amer: >60 mL/min (ref >60)
GFR calc Af Amer: >60 mL/min (ref >60)
GFR calc non Af Amer: >60 mL/min (ref >60)
GFR calc non Af Amer: >60 mL/min (ref >60)
Glucose, Bld: 111 mg/dL — ABNORMAL HIGH (ref 70–99)
Glucose, Bld: 91 mg/dL (ref 70–99)
Potassium: 4.1 mmol/L (ref 3.5–5.1)
Potassium: 4.1 mmol/L (ref 3.5–5.1)
Sodium: 134 mmol/L — ABNORMAL LOW (ref 135–145)
Sodium: 137 mmol/L (ref 135–145)
Total Bilirubin: 0.3 mg/dL (ref 0.3–1.2)
Total Bilirubin: 0.5 mg/dL (ref 0.3–1.2)
Total Protein: 6.5 g/dL (ref 6.5–8.1)
Total Protein: 5.1 g/dL — ABNORMAL LOW (ref 6.5–8.1)

## 2019-03-24 LAB — TROPONIN I (HIGH SENSITIVITY)
Troponin I (High Sensitivity): 1199 ng/L (ref <18)
Troponin I (High Sensitivity): 1548 ng/L (ref <18)
Troponin I (High Sensitivity): 3015 ng/L (ref <18)
Troponin I (High Sensitivity): 4313 ng/L (ref <18)
Troponin I (High Sensitivity): 5254 ng/L (ref <18)

## 2019-03-24 LAB — SARS CORONAVIRUS 2 BY RT PCR (HOSPITAL ORDER, PERFORMED IN ~~LOC~~ HOSPITAL LAB): SARS Coronavirus 2: NEGATIVE

## 2019-03-24 LAB — MAGNESIUM: Magnesium: 1.9 mg/dL (ref 1.7–2.4)

## 2019-03-24 LAB — PROTIME-INR
INR: 0.9 (ref 0.8–1.2)
Prothrombin Time: 12 seconds (ref 11.4–15.2)

## 2019-03-24 LAB — APTT: aPTT: 32 seconds (ref 24–36)

## 2019-03-24 LAB — HEPARIN LEVEL (UNFRACTIONATED): Heparin Unfractionated: 0.41 IU/mL (ref 0.30–0.70)

## 2019-03-24 MED ORDER — SODIUM CHLORIDE 0.9 % WEIGHT BASED INFUSION: 1.0000 mL/kg/h | INTRAVENOUS | Status: DC

## 2019-03-24 MED ORDER — SODIUM CHLORIDE 0.9 % IV SOLN: 250.0000 mL | INTRAVENOUS | Status: DC | PRN

## 2019-03-24 MED ORDER — ASPIRIN EC 81 MG PO TBEC
81.0000 mg | DELAYED_RELEASE_TABLET | Freq: Every day | ORAL | Status: DC
  Administered 2019-03-25 – 2019-03-26 (×2): 81 mg via ORAL
  Filled 2019-03-24 (×2): qty 1

## 2019-03-24 MED ORDER — SODIUM CHLORIDE 0.9% FLUSH: 3.0000 mL | INTRAVENOUS | Status: DC | PRN

## 2019-03-24 MED ORDER — ASPIRIN 81 MG PO CHEW: 81.0000 mg | CHEWABLE_TABLET | Freq: Every day | ORAL | Status: DC

## 2019-03-24 MED ORDER — SODIUM CHLORIDE 0.9 % IV BOLUS: 500.0000 mL | Freq: Once | INTRAVENOUS | Status: AC

## 2019-03-24 MED ORDER — HEPARIN (PORCINE) 25000 UT/250ML-% IV SOLN
600.0000 [IU]/h | INTRAVENOUS | Status: DC
  Administered 2019-03-24: 16:00:00 600 [IU]/h via INTRAVENOUS
  Filled 2019-03-24: qty 250

## 2019-03-24 MED ORDER — HEPARIN BOLUS VIA INFUSION
3200.0000 [IU] | Freq: Once | INTRAVENOUS | Status: AC
  Administered 2019-03-24: 16:00:00 3200 [IU] via INTRAVENOUS
  Filled 2019-03-24: qty 3200

## 2019-03-24 MED ORDER — SIMVASTATIN 20 MG PO TABS: 40.0000 mg | ORAL_TABLET | Freq: Every day | ORAL | Status: DC

## 2019-03-24 MED ORDER — ONDANSETRON HCL 4 MG/2ML IJ SOLN: 4.0000 mg | Freq: Four times a day (QID) | INTRAMUSCULAR | Status: DC | PRN

## 2019-03-24 MED ORDER — ASPIRIN 81 MG PO CHEW: 81.0000 mg | CHEWABLE_TABLET | ORAL | Status: DC

## 2019-03-24 MED ORDER — SODIUM CHLORIDE 0.9 % IV SOLN
INTRAVENOUS | Status: AC
  Administered 2019-03-24 – 2019-03-25 (×3): via INTRAVENOUS

## 2019-03-24 MED ORDER — SODIUM CHLORIDE 0.9 % IV SOLN
Freq: Once | INTRAVENOUS | Status: AC
  Administered 2019-03-24: 20:00:00 via INTRAVENOUS

## 2019-03-24 MED ORDER — LEVOTHYROXINE SODIUM 50 MCG PO TABS
50.0000 ug | ORAL_TABLET | Freq: Every day | ORAL | Status: DC
  Administered 2019-03-25 – 2019-03-26 (×2): 50 ug via ORAL
  Filled 2019-03-24 (×2): qty 1

## 2019-03-24 MED ORDER — SODIUM CHLORIDE 0.9% FLUSH
3.0000 mL | Freq: Two times a day (BID) | INTRAVENOUS | Status: DC
  Administered 2019-03-24 – 2019-03-25 (×2): 3 mL via INTRAVENOUS

## 2019-03-24 MED ORDER — ACETAMINOPHEN 325 MG PO TABS
650.0000 mg | ORAL_TABLET | ORAL | Status: DC | PRN
  Administered 2019-03-25: 14:00:00 650 mg via ORAL

## 2019-03-24 MED ORDER — NITROGLYCERIN 0.4 MG SL SUBL
0.4000 mg | SUBLINGUAL_TABLET | SUBLINGUAL | Status: DC | PRN
  Administered 2019-03-24: 0.4 mg via SUBLINGUAL
  Filled 2019-03-24: qty 1

## 2019-03-24 MED ORDER — ASPIRIN 81 MG PO CHEW
324.0000 mg | CHEWABLE_TABLET | Freq: Once | ORAL | Status: AC
  Administered 2019-03-24: 324 mg via ORAL
  Filled 2019-03-24: qty 4

## 2019-03-24 MED ORDER — FAMOTIDINE IN NACL 20-0.9 MG/50ML-% IV SOLN
20.0000 mg | Freq: Two times a day (BID) | INTRAVENOUS | Status: DC
  Administered 2019-03-24 – 2019-03-26 (×4): 20 mg via INTRAVENOUS
  Filled 2019-03-24 (×4): qty 50

## 2019-03-24 MED ORDER — SODIUM CHLORIDE 0.9 % WEIGHT BASED INFUSION
3.0000 mL/kg/h | INTRAVENOUS | Status: AC
  Administered 2019-03-25: 3 mL/kg/h via INTRAVENOUS

## 2019-03-24 NOTE — Progress Notes (Signed)
Talked to [facility] about patient's episode of 23 beats of Vtach, patient is asleep and asymptomatic. Order to check CMP and magnesium. RN will continue to monitor.

## 2019-03-24 NOTE — Progress Notes (Signed)
[place] for heparin Indication: chest pain/ACS  Allergies  Allergen Reactions  . Formaldehyde Rash    Patient Measurements: Height: '4\' 11"'$  (149.9 cm) Weight: 120 lb (54.4 kg) IBW/kg (Calculated) : 43.2 Heparin Dosing Weight: 54 kg  Vital Signs: Temp: 97.8 F (36.6 C) (08/12 1418) Temp Source: Oral (08/12 1418) BP: 148/67 (08/12 1430) Pulse Rate: 53 (08/12 1430)  Labs: Recent Labs    03/24/19 1231 03/24/19 1425  HGB  --  14.3  HCT  --  43.7  PLT  --  217  TROPONINIHS 1,199*  --     CrCl cannot be calculated (Patient's most recent lab result is older than the maximum 21 days allowed.).   Medical History: Past Medical History:  Diagnosis Date  . Breast cancer ([place]) 2011   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  . Carcinoma of overlapping sites of left breast in female, estrogen receptor positive ([place])   . DVT (deep venous thrombosis) ([place])   . DVT of leg (deep venous thrombosis) ([place]) 1970   while on birth control  . History of left breast cancer 03/25/2010   stage 1, clinical infiltrating carcinoma, tumor size 0.9 cm; margins clear, lymph nodes negative, ER/PR positive/HER2 negative  . History of radiation therapy   . History of spinal fracture   . Hyperlipidemia   . Hypertension   . Hypothyroidism   . Macular degeneration, wet ([place])   . Osteoporosis   . Personal history of radiation therapy 2011   left breast  . Psoriasis      Assessment: 68 year old female admitted with chest pain. No anticoagulation PTA. Pharmacy consulted for heparin drip.  Goal of Therapy:  Heparin level 0.3-0.7 units/ml Monitor platelets by anticoagulation protocol: Yes   Plan:  Baseline labs ordered. Will give heparin 3200 unit bolus followed by heparin drip at 600 units/hr. HL ordered for 2200. CBC with morning labs.  Tawnya Crook, PharmD Clinical Pharmacist 03/24/2019,2:55 PM

## 2019-03-24 NOTE — ED Triage Notes (Signed)
PT sent over from [facility] with HI troponin. PT c/o slight CP.

## 2019-03-24 NOTE — H&P (Signed)
[place] [facility] [place] NAME: Katelyn Manning    MR#:  [id number]  DATE OF BIRTH:  [birth year]  DATE OF ADMISSION:  03/24/2019  PRIMARY CARE PHYSICIAN: Adin [name], MD   REQUESTING/REFERRING PHYSICIAN:   CHIEF COMPLAINT:   Heartburn HISTORY OF PRESENT ILLNESS:  Katelyn Manning  is a 68 y.o. female with a known history of hypertension, hyperlipidemia, hypothyroidism and a history of coronary artery disease just had stress test 2 weeks ago by Dr. Caryl Comes he is presenting to the ED with a chief complaint of heartburn.  Troponin is elevated at around 1500.  Patient is started on heparin drip and hospitalist team is called to admit the patient.  Cardiologist is aware of the consult and other proceeding with cardiac catheterization.  Patient is agreeable.  Husband at bedside. Cover test negative denies any shortness of breath PAST MEDICAL HISTORY:   Past Medical History:  Diagnosis Date  . Breast cancer ([place]) 2011   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  . Carcinoma of overlapping sites of left breast in female, estrogen receptor positive ([place])   . DVT (deep venous thrombosis) ([place])   . DVT of leg (deep venous thrombosis) ([place]) 1970   while on birth control  . History of left breast cancer 03/25/2010   stage 1, clinical infiltrating carcinoma, tumor size 0.9 cm; margins clear, lymph nodes negative, ER/PR positive/HER2 negative  . History of radiation therapy   . History of spinal fracture   . Hyperlipidemia   . Hypertension   . Hypothyroidism   . Macular degeneration, wet ([place])   . Osteoporosis   . Personal history of radiation therapy 2011   left breast  . Psoriasis     PAST SURGICAL HISTOIRY:   Past Surgical History:  Procedure Laterality Date  . BREAST BIOPSY Left 03/08/2010   Tubular carcinoma  . BREAST LUMPECTOMY Left 03/28/2010   tubular carcinoma and 2 benign LN  . COLONOSCOPY WITH PROPOFOL N/A 03/27/2017    Procedure: COLONOSCOPY WITH PROPOFOL;  Surgeon: Lollie Sails, MD;  Location: [facility] ENDOSCOPY;  Service: Endoscopy;  Laterality: N/A;  . FOOT SURGERY  1999  . TUBAL LIGATION      SOCIAL HISTORY:   Social History   Tobacco Use  . Smoking status: Never Smoker  . Smokeless tobacco: Never Used  Substance Use Topics  . Alcohol use: Yes    Alcohol/week: 0.0 standard drinks    Comment: occasional alcohol use 2-3 times a week    FAMILY HISTORY:   Family History  Problem Relation Age of Onset  . Breast cancer Maternal Aunt 70  . Diabetes Other   . Hypertension Other   . Stroke Other   . Diabetes Brother   . Heart disease Brother   . Heart attack Brother     DRUG ALLERGIES:   Allergies  Allergen Reactions  . Formaldehyde Rash    REVIEW OF SYSTEMS:  CONSTITUTIONAL: No fever, fatigue or weakness.  EYES: No blurred or double vision.  EARS, NOSE, AND THROAT: No tinnitus or ear pain.  RESPIRATORY: No cough, shortness of breath, wheezing or hemoptysis.  CARDIOVASCULAR: No chest pain, reporting burning sensation in the heart no orthopnea, edema.  GASTROINTESTINAL: No nausea, vomiting, diarrhea or abdominal pain.  GENITOURINARY: No dysuria, hematuria.  ENDOCRINE: No polyuria, nocturia,  HEMATOLOGY: No anemia, easy bruising or bleeding SKIN: No rash or lesion. MUSCULOSKELETAL: No joint pain or arthritis.   NEUROLOGIC: No tingling,  numbness, weakness.  PSYCHIATRY: No anxiety or depression.   MEDICATIONS AT HOME:   Prior to Admission medications   Medication Sig Start Date End Date Taking? Authorizing Provider  aspirin 81 MG chewable tablet Chew 81 mg by mouth daily.     [provider]  Calcium Carbonate-Vitamin D (CALCIUM 600+D) 600-200 MG-UNIT TABS Take 1 tablet by mouth daily.     [provider]  Cholecalciferol (VITAMIN D3) 2000 UNITS capsule Take 2,000 Units by mouth daily.     [provider]  denosumab (PROLIA) 60 MG/ML SOLN injection  Inject 60 mg into the skin every 6 (six) months. Administer in upper arm, thigh, or abdomen    [provider]  levothyroxine (SYNTHROID, LEVOTHROID) 50 MCG tablet Take 50 mcg by mouth daily before breakfast.  03/15/15   [provider]  Multiple Vitamin (MULTI-VITAMINS) TABS Take 1 tablet by mouth daily.     [provider]  Multiple Vitamins-Minerals (ICAPS AREDS 2) CAPS Take 2 capsules by mouth 2 (two) times daily at 8 am and 10 pm.    [provider]  simvastatin (ZOCOR) 40 MG tablet Take 40 mg by mouth daily at 6 PM.  03/15/15   [provider]      VITAL SIGNS:  Blood pressure 139/62, pulse (!) 34, temperature 97.8 F (36.6 C), temperature source Oral, resp. rate 17, height _0  (1.499 m), weight 54.4 kg, SpO2 100 %.  PHYSICAL EXAMINATION:  GENERAL:  68 y.o.-year-old patient lying in the bed with no acute distress.  EYES: Pupils equal, round, reactive to light and accommodation. No scleral icterus. Extraocular muscles intact.  HEENT: Head atraumatic, normocephalic. Oropharynx and nasopharynx clear.  NECK:  Supple, no jugular venous distention. No thyroid enlargement, no tenderness.  LUNGS: Normal breath sounds bilaterally, no wheezing, rales,rhonchi or crepitation. No use of accessory muscles of respiration.  CARDIOVASCULAR: S1, S2 normal. No murmurs, rubs, or gallops.  No reproducible anterior chest wall tenderness ABDOMEN: Soft, nontender, nondistended. Bowel sounds present. EXTREMITIES: No pedal edema, cyanosis, or clubbing.  NEUROLOGIC: Cranial nerves II through XII are intact. Muscle strength 5/5 in all extremities. Sensation intact. Gait not checked.  PSYCHIATRIC: The patient is alert and oriented x 3.  SKIN: No obvious rash, lesion, or ulcer.   LABORATORY PANEL:   CBC Recent Labs  Lab 03/24/19 1425  WBC 9.9  HGB 14.3  HCT 43.7  PLT 217    ------------------------------------------------------------------------------------------------------------------  Chemistries  Recent Labs  Lab 03/24/19 1500  NA 134*  K 4.1  CL 100  CO2 26  GLUCOSE 111*  BUN 14  CREATININE 0.63  CALCIUM 9.6  AST 48*  ALT 20  ALKPHOS 70  BILITOT 0.3   ------------------------------------------------------------------------------------------------------------------  Cardiac Enzymes No results for input(s): TROPONINI in the last 168 hours. ------------------------------------------------------------------------------------------------------------------  RADIOLOGY:  Dg Chest Portable 1 View  Result Date: 03/24/2019 CLINICAL DATA:  Chest pain EXAM: PORTABLE CHEST 1 VIEW COMPARISON:  None. FINDINGS: Coarse mildly reticular lung opacity. No focal consolidation or effusion. Borderline heart size. No pneumothorax. IMPRESSION: No active disease. Coarse pulmonary opacity, suspect chronic change. Electronically Signed   By: Donavan Foil M.D.   On: 03/24/2019 15:22    EKG:   Orders placed or performed during the hospital encounter of 03/24/19  . EKG 12-Lead  . EKG 12-Lead    IMPRESSION AND PLAN:     #Non-STEMI Admit to telemetry Heparin drip started will continue per pharmacy consult N.p.o., IV fluids Cardiologist is aware and proceeding  with cardiac catheterization Nitroglycerin as needed for chest pain Continue baby aspirin and statin Not ordering beta-blocker as patient is bradycardic  #Hypothyroidism continue Synthyroid  #Sinus bradycardia hold rate limiting drugs including beta-blocker  #Essential hypertension-holding beta-blocker in view of sinus bradycardia Blood pressure is stable at this time  #GERD Pepcid   DVT prophylaxis on heparin drip All the records are reviewed and case discussed with ED provider. Management plans discussed with the patient, husband at bedside and they are in agreement.  CODE STATUS: fc    TOTAL TIME TAKING CARE OF THIS PATIENT: 45 minutes.   Note: This dictation was prepared with Dragon dictation along with smaller phrase technology. Any transcriptional errors that result from this process are unintentional.  Nicholes Mango M.D on 03/24/2019 at 4:55 PM  Between 7am to 6pm - Pager - [phone]  After 6pm go to [email or web address] - password [email or web address] [facility]  [facility]  Office  [phone]  CC: Primary care physician; Adin [name], MD

## 2019-03-24 NOTE — ED Notes (Signed)
First Nurse Note; Pt sent over from [facility] where she was being seen for ongoing epigastric pain w/ negative cardiac workup approximately 2 weeks ago.  During visit, patient with syncopal episode, blood work performed, patient sent over due to positive troponin.  NAD noted upon arrival.

## 2019-03-24 NOTE — ED Provider Notes (Signed)
[facility] Emergency Department Provider Note  Time seen: 2:40 PM  I have reviewed the triage vital signs and the nursing notes.   HISTORY  Chief Complaint Chest pain, abnormal lab work  HPI Katelyn Manning is a 68 y.o. female with past medical history of hypertension, hyperlipidemia, presents emergency department for abnormal lab work.  According to the patient over the past several weeks she has been experiencing significant burning sensation in her chest which she believes was heartburn.  Patient states she had a stress test performed 2 to 3 weeks ago that was normal.  She states last night around 2:00 in the morning she had severe heartburn, states she had a sensation of tightness in her throat became very nauseous and lightheaded.  Patient states it was somewhat better this morning she went to see Dr. Ola Spurr today regarding her symptoms had several more episodes of burning sensation lightheadedness actually had a syncopal episode at Dr. Blane Ohara office.  Patient was sent to the emergency department for further work-up after her troponin resulted elevated greater than 1000.  Patient denies any chest pain or burning currently.  Denies any pleuritic pain.  Denies any leg pain or swelling.  Denies any recent fever cough or congestion.  Past Medical History:  Diagnosis Date  . 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 Carcinoma of overlapping sites of left breast in female, estrogen receptor positive ([place])   . DVT (deep venous thrombosis) ([place])   . DVT of leg (deep venous thrombosis) ([place]) 1970   while on birth control  . History of left breast cancer 03/25/2010   stage 1, clinical infiltrating carcinoma, tumor size 0.9 cm; margins clear, lymph nodes negative, ER/PR positive/HER2 negative  . History of radiation therapy   . History of spinal fracture   . Hyperlipidemia   . Hypertension   . Hypothyroidism   . Macular  degeneration, wet ([place])   . Osteoporosis   . Personal history of radiation therapy 2011   left breast  . Psoriasis     Patient Active Problem List   Diagnosis Date Noted  . Carcinoma of overlapping sites of left breast in female, estrogen receptor positive ([place]) 05/23/2016    Past Surgical History:  Procedure Laterality Date  . BREAST BIOPSY Left 03/08/2010   Tubular carcinoma  . BREAST LUMPECTOMY Left 03/28/2010   tubular carcinoma and 2 benign LN  . COLONOSCOPY WITH PROPOFOL N/A 03/27/2017   Procedure: COLONOSCOPY WITH PROPOFOL;  Surgeon: Lollie Sails, MD;  Location: [facility] ENDOSCOPY;  Service: Endoscopy;  Laterality: N/A;  . FOOT SURGERY  1999  . TUBAL LIGATION      Prior to Admission medications   Medication Sig Start Date End Date Taking? Authorizing Provider  aspirin 81 MG chewable tablet Chew 81 mg by mouth daily.     [provider]  Calcium Carbonate-Vitamin D (CALCIUM 600+D) 600-200 MG-UNIT TABS Take 1 tablet by mouth daily.     [provider]  Cholecalciferol (VITAMIN D3) 2000 UNITS capsule Take 2,000 Units by mouth daily.     [provider]  denosumab (PROLIA) 60 MG/ML SOLN injection Inject 60 mg into the skin every 6 (six) months. Administer in upper arm, thigh, or abdomen    [provider]  levothyroxine (SYNTHROID, LEVOTHROID) 50 MCG tablet Take 50 mcg by mouth daily before breakfast.  03/15/15   [provider]  Multiple Vitamin (MULTI-VITAMINS) TABS Take 1 tablet  by mouth daily.     [provider]  Multiple Vitamins-Minerals (ICAPS AREDS 2) CAPS Take 2 capsules by mouth 2 (two) times daily at 8 am and 10 pm.    [provider]  simvastatin (ZOCOR) 40 MG tablet Take 40 mg by mouth daily at 6 PM.  03/15/15   [provider]    Allergies  Allergen Reactions  . Formaldehyde Rash    Family History  Problem Relation Age of Onset  . Breast cancer Maternal Aunt 70  . Diabetes Other   .  Hypertension Other   . Stroke Other   . Diabetes Brother   . Heart disease Brother   . Heart attack Brother     Social History Social History   Tobacco Use  . Smoking status: Never Smoker  . Smokeless tobacco: Never Used  Substance Use Topics  . Alcohol use: Yes    Alcohol/week: 0.0 standard drinks    Comment: occasional alcohol use 2-3 times a week  . Drug use: No    Review of Systems Constitutional: Negative for fever. ENT: Negative for recent illness/congestion Cardiovascular: Positive for chest burning sensation worse over the past 24 hours. Respiratory: Negative for shortness of breath.  Negative for cough. Gastrointestinal: Negative for abdominal pain, vomiting.  Positive for nausea. Musculoskeletal: Negative for leg pain or swelling Skin: Negative for skin complaints  Neurological: Negative for headache All other ROS negative  ____________________________________________   PHYSICAL EXAM:  VITAL SIGNS: ED Triage Vitals  Enc Vitals Group     BP 03/24/19 1409 (!) 112/55     Pulse Rate 03/24/19 1409 (!) 58     Resp 03/24/19 1409 16     Temp 03/24/19 1418 97.8 F (36.6 C)     Temp Source 03/24/19 1418 Oral     SpO2 03/24/19 1418 100 %     Weight --      Height --      Head Circumference --      Peak Flow --      Pain Score 03/24/19 1406 3     Pain Loc --      Pain Edu? --      Excl. in [place]? --    Constitutional: Alert and oriented. Well appearing and in no distress. Eyes: Normal exam ENT      Head: Normocephalic and atraumatic      Mouth/Throat: Mucous membranes are moist. Cardiovascular: Normal rate, regular rhythm Respiratory: Normal respiratory effort without tachypnea nor retractions. Breath sounds are clear  Gastrointestinal: Soft and nontender. No distention. Musculoskeletal: Nontender with normal range of motion in all extremities. Neurologic:  Normal speech and language. No gross focal neurologic deficits Skin:  Skin is warm, dry and intact.   Psychiatric: Mood and affect are normal.   ____________________________________________    EKG  EKG viewed and interpreted by myself shows sinus bradycardia 48 bpm with a narrow QRS, normal axis, normal intervals, nonspecific ST changes without elevation.  ____________________________________________    RADIOLOGY  Chest x-ray negative  ____________________________________________   INITIAL IMPRESSION / ASSESSMENT AND PLAN / ED COURSE  Pertinent labs & imaging results that were available during my care of the patient were reviewed by me and considered in my medical decision making (see chart for details).   Patient presents emergency department for an elevated troponin.  Patient states over the past several weeks she has had significant heartburn, had a stress test performed 2 to 3 weeks ago that was normal per patient.  Heartburn was significantly worse last night and today with lightheadedness and one syncopal episode today.  Denies any chest discomfort currently.  Patient's outpatient lab work showed a troponin of 1100, given her complaints of heartburn nausea and lightheadedness highly suspect NSTEMI.  Patient denies any pleuritic pain.  No leg pain or swelling.  Normal heart rate of 53, do not suspect PE.  We will start the patient on heparin, recheck labs in the emergency department.  Patient will require admission to the hospitalist service for further treatment.  Patient's troponin elevated from 11 100-15 100/2 hours.  I discussed the patient with Dr. Nehemiah Massed who is come down to the emergency department to see the patient in person.  A third troponin has been sent which has resulted at 3000.  Patient on heparin.  Cardiology closely monitoring the patient for possible catheterization.  EMMERSYN KRATZKE was evaluated in Emergency Department on 03/24/2019 for the symptoms described in the history of present illness. She was evaluated in the context of the global COVID-19 pandemic,  which necessitated consideration that the patient might be at risk for infection with the SARS-CoV-2 virus that causes COVID-19. Institutional protocols and algorithms that pertain to the evaluation of patients at risk for COVID-19 are in a state of rapid change based on information released by regulatory bodies including the CDC and federal and state organizations. These policies and algorithms were followed during the patient's care in the ED.  CRITICAL CARE Performed by: Harvest Dark   Total critical care time: 30 minutes  Critical care time was exclusive of separately billable procedures and treating other patients.  Critical care was necessary to treat or prevent imminent or life-threatening deterioration.  Critical care was time spent personally by me on the following activities: development of treatment plan with patient and/or surrogate as well as nursing, discussions with consultants, evaluation of patient's response to treatment, examination of patient, obtaining history from patient or surrogate, ordering and performing treatments and interventions, ordering and review of laboratory studies, ordering and review of radiographic studies, pulse oximetry and re-evaluation of patient's condition.   ____________________________________________   FINAL CLINICAL IMPRESSION(S) / ED DIAGNOSES  NSTEMI   Harvest Dark, MD 03/24/19 1735

## 2019-03-24 NOTE — ED Notes (Signed)
Report to Tasha RN.

## 2019-03-24 NOTE — Progress Notes (Signed)
Late Entry: 1958 patient report she was having chest heaviness 5/10 radiating in her jaw and arms. BP is 108/52, HR 49, Dr. Margaretmary Eddy and Dr. Erin Fulling made awre order to give [facility], recheck BP after nitro and BP went down to 86/38 order to give 500 bolus and to start IVF. Stat EKG was ordered, does not show STEMI. Recheck patient's chest pain and went down to 2 after nitro, patient is chest pain free now. BP after bolus is 97/38. Order to continue regimen with SL nitro and IVF if patient have another chest pain. RN will continue to monitor.

## 2019-03-24 NOTE — ED Notes (Signed)
Pt reports from [facility] with elevated troponin and chest pain that started within the last few weeks but began worsening last night. SHOB with ambulation and dizziness. Pt reports that she lost consciousness at [facility] while receiving blood tests.

## 2019-03-24 NOTE — Progress Notes (Signed)
Family Meeting Note  Advance Directive:yes  Today a meeting took place with the Patient.    The following clinical team members were present during this meeting:MD  The following were discussed:Patient's diagnosis: Non-STEMI, hypertension, hyperlipidemia, hypothyroidism will be admitted to the hospital.  Cardiologist is proceeding with cardiac catheterization.  Patient is aware of the plan and agreeable,   Patient's progosis: Unable to determine and Goals for treatment: Full Code  Husband is the healthcare power of attorney  Additional follow-up to be provided: Hospitalist and cardiologist  Time spent during discussion:17 min   Nicholes Mango, MD

## 2019-03-24 NOTE — Consult Note (Signed)
[facility] Cardiology Consultation Note  Patient ID: Katelyn Manning, MRN: [id number], DOB/AGE: [birth year] 70 y.o. Admit date: 03/24/2019   Date of Consult: 03/24/2019 Primary Physician: Adin [name], MD Primary Cardiologist: None  Chief Complaint: No chief complaint on file.  Reason for Consult: Of myocardial infarction  HPI: 68 y.o. female with known hyperlipidemia borderline hypertension and family history of cardiovascular disease who is had a significant progression of heartburn off and on over the last several weeks.  His heartburn has occurred with and without physical activity and is concerning for cardiovascular disease.  The patient has undergone a submaximal stress test 2 weeks prior with no evidence of anginal symptoms.  Full report and images are not seen at this time.  The patient since then has had significant heartburn and lower chest discomfort for the last many hours and was seen in the emergency room.  EKG currently stays normal sinus rhythm with nonspecific ST changes mainly in the inferior lateral leads..  Currently there is not 1 mm ST elevation the patient does have elevated troponin consistent with myocardial infarction and currently has improved symptoms with heparin nitroglycerin and aspirin.  The patient is hemodynamically stable  Past Medical History:  Diagnosis Date  . 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 Carcinoma of overlapping sites of left breast in female, estrogen receptor positive ([place])   . DVT (deep venous thrombosis) ([place])   . DVT of leg (deep venous thrombosis) ([place]) 1970   while on birth control  . History of left breast cancer 03/25/2010   stage 1, clinical infiltrating carcinoma, tumor size 0.9 cm; margins clear, lymph nodes negative, ER/PR positive/HER2 negative  . History of radiation therapy   . History of spinal fracture   . Hyperlipidemia   . Hypertension   . Hypothyroidism   . Macular  degeneration, wet ([place])   . Osteoporosis   . Personal history of radiation therapy 2011   left breast  . Psoriasis       Surgical History:  Past Surgical History:  Procedure Laterality Date  . BREAST BIOPSY Left 03/08/2010   Tubular carcinoma  . BREAST LUMPECTOMY Left 03/28/2010   tubular carcinoma and 2 benign LN  . COLONOSCOPY WITH PROPOFOL N/A 03/27/2017   Procedure: COLONOSCOPY WITH PROPOFOL;  Surgeon: Lollie Sails, MD;  Location: [facility] ENDOSCOPY;  Service: Endoscopy;  Laterality: N/A;  . FOOT SURGERY  1999  . TUBAL LIGATION       Home Meds: Prior to Admission medications   Medication Sig Start Date End Date Taking? Authorizing Provider  aspirin 81 MG chewable tablet Chew 81 mg by mouth daily.     [provider]  Calcium Carbonate-Vitamin D (CALCIUM 600+D) 600-200 MG-UNIT TABS Take 1 tablet by mouth daily.     [provider]  Cholecalciferol (VITAMIN D3) 2000 UNITS capsule Take 2,000 Units by mouth daily.     [provider]  denosumab (PROLIA) 60 MG/ML SOLN injection Inject 60 mg into the skin every 6 (six) months. Administer in upper arm, thigh, or abdomen    [provider]  levothyroxine (SYNTHROID, LEVOTHROID) 50 MCG tablet Take 50 mcg by mouth daily before breakfast.  03/15/15   [provider]  Multiple Vitamin (MULTI-VITAMINS) TABS Take 1 tablet by mouth daily.     [provider]  Multiple Vitamins-Minerals (ICAPS AREDS 2) CAPS Take 2 capsules by mouth 2 (two) times daily at 8 am  and 10 pm.    [provider]  simvastatin (ZOCOR) 40 MG tablet Take 40 mg by mouth daily at 6 PM.  03/15/15   [provider]    Inpatient Medications:  . sodium chloride flush  3 mL Intravenous Q12H   . heparin 600 Units/hr (03/24/19 1530)    Allergies:  Allergies  Allergen Reactions  . Formaldehyde Rash    Social History   Socioeconomic History  . Marital status: Married    Spouse name: Not on file  .  Number of children: Not on file  . Years of education: Not on file  . Highest education level: Not on file  Occupational History  . Not on file  Social Needs  . Financial resource strain: Not on file  . Food insecurity    Worry: Not on file    Inability: Not on file  . Transportation needs    Medical: Not on file    Non-medical: Not on file  Tobacco Use  . Smoking status: Never Smoker  . Smokeless tobacco: Never Used  Substance and Sexual Activity  . Alcohol use: Yes    Alcohol/week: 0.0 standard drinks    Comment: occasional alcohol use 2-3 times a week  . Drug use: No  . Sexual activity: Not on file  Lifestyle  . Physical activity    Days per week: Not on file    Minutes per session: Not on file  . Stress: Not on file  Relationships  . Social Herbalist on phone: Not on file    Gets together: Not on file    Attends religious service: Not on file    Active member of club or organization: Not on file    Attends meetings of clubs or organizations: Not on file    Relationship status: Not on file  . Intimate partner violence    Fear of current or ex partner: Not on file    Emotionally abused: Not on file    Physically abused: Not on file    Forced sexual activity: Not on file  Other Topics Concern  . Not on file  Social History Narrative  . Not on file     Family History  Problem Relation Age of Onset  . Breast cancer Maternal Aunt 70  . Diabetes Other   . Hypertension Other   . Stroke Other   . Diabetes Brother   . Heart disease Brother   . Heart attack Brother      Review of Systems Positive for chest pain heartburn Negative for: General:  chills, fever, night sweats or weight changes.  Cardiovascular: PND orthopnea syncope dizziness  Dermatological skin lesions rashes Respiratory: Cough congestion Urologic: Frequent urination urination at night and hematuria Abdominal: negative for nausea, vomiting, diarrhea, bright red blood per rectum,  melena, or hematemesis Neurologic: negative for visual changes, and/or hearing changes  All other systems reviewed and are otherwise negative except as noted above.  Labs: No results for input(s): CKTOTAL, CKMB, TROPONINI in the last 72 hours. Lab Results  Component Value Date   WBC 9.9 03/24/2019   HGB 14.3 03/24/2019   HCT 43.7 03/24/2019   MCV 95.6 03/24/2019   PLT 217 03/24/2019    Recent Labs  Lab 03/24/19 1500  NA 134*  K 4.1  CL 100  CO2 26  BUN 14  CREATININE 0.63  CALCIUM 9.6  PROT 6.5  BILITOT 0.3  ALKPHOS 70  ALT 20  AST 48*  GLUCOSE 111*   No results found for: CHOL, HDL, LDLCALC, TRIG No results found for: DDIMER  Radiology/Studies:  Dg Chest Portable 1 View  Result Date: 03/24/2019 CLINICAL DATA:  Chest pain EXAM: PORTABLE CHEST 1 VIEW COMPARISON:  None. FINDINGS: Coarse mildly reticular lung opacity. No focal consolidation or effusion. Borderline heart size. No pneumothorax. IMPRESSION: No active disease. Coarse pulmonary opacity, suspect chronic change. Electronically Signed   By: Donavan Foil M.D.   On: 03/24/2019 15:22    EKG: Normal sinus rhythm with nonspecific ST changes  Weights: Filed Weights   03/24/19 1452  Weight: 54.4 kg     Physical Exam: Blood pressure (!) 148/67, pulse (!) 53, temperature 97.8 F (36.6 C), temperature source Oral, resp. rate 14, height '4\' 11"'$  (1.499 m), weight 54.4 kg, SpO2 100 %. Body mass index is 24.24 kg/m. General: Well developed, well nourished, in no acute distress. Head eyes ears nose throat: Normocephalic, atraumatic, sclera non-icteric, no xanthomas, nares are without discharge. No apparent thyromegaly and/or mass  Lungs: Normal respiratory effort.  no wheezes, no rales, no rhonchi.  Heart: RRR with normal S1 S2. no murmur gallop, no rub, PMI is normal size and placement, carotid upstroke normal without bruit, jugular venous pressure is normal Abdomen: Soft, non-tender, non-distended with normoactive  bowel sounds. No hepatomegaly. No rebound/guarding. No obvious abdominal masses. Abdominal aorta is normal size without bruit Extremities: No edema. no cyanosis, no clubbing, no ulcers  Peripheral : 2+ bilateral upper extremity pulses, 2+ bilateral femoral pulses, 2+ bilateral dorsal pedal pulse Neuro: Alert and oriented. No facial asymmetry. No focal deficit. Moves all extremities spontaneously. Musculoskeletal: Normal muscle tone without kyphosis Psych:  Responds to questions appropriately with a normal affect.    Assessment: And plan 1.  Heparin aspirin nitroglycerin for acute non-ST elevation myocardial infarction 2.  Avoid beta-blocker due to bradycardia 3.  Further intervention if patient has significant progression of symptoms with repeat EKG for further assessment of progression of issues 4.  Proceed to cardiac catheterization to assess coronary anatomy and further treatment thereof is necessary.  Patient understands the risk and benefits of cardiac catheterization.  This includes the possibility of death stroke heart attack infection bleeding or blood clot.  She is at low risk for conscious sedation      Signed, Corey Skains M.D. [facility] 03/24/2019, 4:17 PM

## 2019-03-24 NOTE — ED Notes (Signed)
ED TO INPATIENT HANDOFF REPORT  ED Nurse Name and Phone #: ally [phone]  S Name/Age/Gender Katelyn Manning 68 y.o. female Room/Bed: ED15A/ED15A  Code Status   Code Status: Not on file  Home/SNF/Other Home Patient oriented to: self, place, time and situation Is this baseline? Yes   Triage Complete: Triage complete  Chief Complaint abnormal labs  Triage Note PT sent over from [facility] with HI troponin. PT c/o slight CP.    Allergies Allergies  Allergen Reactions  . Formaldehyde Rash    Level of Care/Admitting Diagnosis ED Disposition    ED Disposition Condition [facility] Area: [place] [[id number]]  Level of Care: Telemetry [5]  Covid Evaluation: Confirmed COVID Negative  Diagnosis: NSTEMI (non-ST elevated myocardial infarction) [facility]) [[id number]]  Admitting Physician: Nicholes Mango [[id number]]  Attending Physician: Nicholes Mango [[id number]]  Estimated length of stay: 3 - 4 days  Certification:: I certify this patient will need inpatient services for at least 2 midnights  PT Class (Do Not Modify): Inpatient [101]  PT Acc Code (Do Not Modify): Private [1]       B Medical/Surgery History Past Medical History:  Diagnosis Date  . Breast cancer ([place]) 2011   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  . Carcinoma of overlapping sites of left breast in female, estrogen receptor positive ([place])   . DVT (deep venous thrombosis) ([place])   . DVT of leg (deep venous thrombosis) ([place]) 1970   while on birth control  . History of left breast cancer 03/25/2010   stage 1, clinical infiltrating carcinoma, tumor size 0.9 cm; margins clear, lymph nodes negative, ER/PR positive/HER2 negative  . History of radiation therapy   . History of spinal fracture   . Hyperlipidemia   . Hypertension   . Hypothyroidism   . Macular degeneration, wet ([place])   . Osteoporosis   . Personal history of radiation therapy 2011   left breast  . Psoriasis    Past Surgical  History:  Procedure Laterality Date  . BREAST BIOPSY Left 03/08/2010   Tubular carcinoma  . BREAST LUMPECTOMY Left 03/28/2010   tubular carcinoma and 2 benign LN  . COLONOSCOPY WITH PROPOFOL N/A 03/27/2017   Procedure: COLONOSCOPY WITH PROPOFOL;  Surgeon: Lollie Sails, MD;  [facility] END[facility]COPY;  Service: Endoscopy;  Laterality: N/A;  . FOOT SURGERY  1999  . TUBAL LIGATION       A IV Location/Drains/Wounds Patient Lines/Drains/Airways Status   Active Line/Drains/Airways    Name:   Placement date:   Placement time:   Site:   Days:   Peripheral IV 03/24/19 Right Forearm   03/24/19    1427    Forearm   less than 1   Peripheral IV 03/24/19 Left Antecubital   03/24/19    1447    Antecubital   less than 1   Airway   03/27/17    1141     727          Intake/Output Last 24 hours No intake or output data in the 24 hours ending 03/24/19 1713  Labs/Imaging Results for orders placed or performed during the hospital encounter of 03/24/19 (from the past 48 hour(s))  CBC with Differential/Platelet     Status: None   Collection Time: 03/24/19  2:25 PM  Result Value Ref Range   WBC 9.9 4.0 - 10.5 K/uL   RBC 4.57 3.87 - 5.11 MIL/uL   Hemoglobin 14.3 12.0 - 15.0 g/dL  HCT 43.7 36.0 - 46.0 %   MCV 95.6 80.0 - 100.0 fL   MCH 31.3 26.0 - 34.0 pg   MCHC 32.7 30.0 - 36.0 g/dL   RDW 13.0 11.5 - 15.5 %   Platelets 217 150 - 400 K/uL   nRBC 0.0 0.0 - 0.2 %   Neutrophils Relative % 73 %   Neutro Abs 7.3 1.7 - 7.7 K/uL   Lymphocytes Relative 16 %   Lymphs Abs 1.6 0.7 - 4.0 K/uL   Monocytes Relative 9 %   Monocytes Absolute 0.9 0.1 - 1.0 K/uL   Eosinophils Relative 1 %   Eosinophils Absolute 0.1 0.0 - 0.5 K/uL   Basophils Relative 1 %   Basophils Absolute 0.1 0.0 - 0.1 K/uL   Immature Granulocytes 0 %   Abs Immature Granulocytes 0.04 0.00 - 0.07 K/uL    Comment: Performed at [facility], [address] [place], [place] [address]  SARS Coronavirus 2 [facility] order,  Performed in [facility] hospital lab) Nasopharyngeal Nasopharyngeal Swab     Status: None   Collection Time: 03/24/19  2:25 PM   Specimen: Nasopharyngeal Swab  Result Value Ref Range   SARS Coronavirus 2 NEGATIVE NEGATIVE    Comment: (NOTE) If result is NEGATIVE SARS-CoV-2 target nucleic acids are NOT DETECTED. The SARS-CoV-2 RNA is generally detectable in upper and lower  respiratory specimens during the acute phase of infection. The lowest  concentration of SARS-CoV-2 viral copies this assay can detect is 250  copies / mL. A negative result does not preclude SARS-CoV-2 infection  and should not be used as the sole basis for treatment or other  patient management decisions.  A negative result may occur with  improper specimen collection / handling, submission of specimen other  than nasopharyngeal swab, presence of viral mutation(s) within the  areas targeted by this assay, and inadequate number of viral copies  (<250 copies / mL). A negative result must be combined with clinical  observations, patient history, and epidemiological information. If result is POSITIVE SARS-CoV-2 target nucleic acids are DETECTED. The SARS-CoV-2 RNA is generally detectable in upper and lower  respiratory specimens dur ing the acute phase of infection.  Positive  results are indicative of active infection with SARS-CoV-2.  Clinical  correlation with patient history and other diagnostic information is  necessary to determine patient infection status.  Positive results do  not rule out bacterial infection or co-infection with other viruses. If result is PRESUMPTIVE POSTIVE SARS-CoV-2 nucleic acids MAY BE PRESENT.   A presumptive positive result was obtained on the submitted specimen  and confirmed on repeat testing.  While 2019 novel coronavirus  (SARS-CoV-2) nucleic acids may be present in the submitted sample  additional confirmatory testing may be necessary for epidemiological  and / or clinical  management purposes  to differentiate between  SARS-CoV-2 and other Sarbecovirus currently known to infect humans.  If clinically indicated additional testing with an alternate test  methodology [phone]) is advised. The SARS-CoV-2 RNA is generally  detectable in upper and lower respiratory sp ecimens during the acute  phase of infection. The expected result is Negative. Fact Sheet for Patients:  StrictlyIdeas.no Fact Sheet for Healthcare Providers: BankingDealers.co.za This test is not yet approved or cleared by the [place] FDA and has been authorized for detection and/or diagnosis of SARS-CoV-2 by FDA under an Emergency Use Authorization (EUA).  This EUA will remain in effect (meaning this test can be used) for the duration of the  COVID-19 declaration under Section 564(b)(1) of the Act, 21 U.S.C. section 360bbb-3(b)(1), unless the authorization is terminated or revoked sooner. Performed at [facility], [address] [place], [place] [address]   Troponin I (High Sensitivity)     Status: Abnormal   Collection Time: 03/24/19  2:25 PM  Result Value Ref Range   Troponin I (High Sensitivity) 1,548 (HH) <18 ng/L    Comment: READ BACK AND VERIFIED WITH ALLY Galen Russman 03/24/19 @ 1519  MLK (NOTE) Elevated high sensitivity troponin I (hsTnI) values and significant  changes across serial measurements may suggest ACS but many other  chronic and acute conditions are known to elevate hsTnI results.  Refer to the "Links" section for chest pain algorithms and additional  guidance. Performed at [facility], [address] [place], [place] [address]   APTT     Status: None   Collection Time: 03/24/19  2:45 PM  Result Value Ref Range   aPTT 32 24 - 36 seconds    Comment: Performed at [facility], [address] [place], [place] [address]  Protime-INR     Status: None   Collection Time: 03/24/19  2:45 PM   Result Value Ref Range   Prothrombin Time 12.0 11.4 - 15.2 seconds   INR 0.9 0.8 - 1.2    Comment: (NOTE) INR goal varies based on device and disease states. Performed at [facility], [address] [place], [place] [address]   Comprehensive metabolic panel     Status: Abnormal   Collection Time: 03/24/19  3:00 PM  Result Value Ref Range   Sodium 134 (L) 135 - 145 mmol/L   Potassium 4.1 3.5 - 5.1 mmol/L   Chloride 100 98 - 111 mmol/L   CO2 26 22 - 32 mmol/L   Glucose, Bld 111 (H) 70 - 99 mg/dL   BUN 14 8 - 23 mg/dL   Creatinine, Ser 0.63 0.44 - 1.00 mg/dL   Calcium 9.6 8.9 - 10.3 mg/dL   Total Protein 6.5 6.5 - 8.1 g/dL   Albumin 3.7 3.5 - 5.0 g/dL   AST 48 (H) 15 - 41 U/L   ALT 20 0 - 44 U/L   Alkaline Phosphatase 70 38 - 126 U/L   Total Bilirubin 0.3 0.3 - 1.2 mg/dL   GFR calc non Af Amer >60 >60 mL/min   GFR calc Af Amer >60 >60 mL/min   Anion gap 8 5 - 15    Comment: Performed at [facility], [address] [place], [place] [address]  Troponin I (High Sensitivity)     Status: Abnormal   Collection Time: 03/24/19  4:21 PM  Result Value Ref Range   Troponin I (High Sensitivity) 3,015 (HH) <18 ng/L    Comment: CRITICAL VALUE NOTED. VALUE IS CONSISTENT WITH PREVIOUSLY REPORTED/CALLED VALUE MJU (NOTE) Elevated high sensitivity troponin I (hsTnI) values and significant  changes across serial measurements may suggest ACS but many other  chronic and acute conditions are known to elevate hsTnI results.  Refer to the "Links" section for chest pain algorithms and additional  guidance. Performed at [facility], [address] [place], [place] [address]    Dg Chest Portable 1 View  Result Date: 03/24/2019 CLINICAL DATA:  Chest pain EXAM: PORTABLE CHEST 1 VIEW COMPARISON:  None. FINDINGS: Coarse mildly reticular lung opacity. No focal consolidation or effusion. Borderline heart size. No pneumothorax. IMPRESSION: No active disease. Coarse  pulmonary opacity, suspect chronic change. Electronically Signed  By: Donavan Foil M.D.   On: 03/24/2019 15:22    Pending Labs Unresulted Labs (From admission, onward)    Start     Ordered   03/25/19 0500  CBC  Tomorrow morning,   STAT     03/24/19 1502   03/24/19 2200  Heparin level (unfractionated)  Once-Timed,   STAT     03/24/19 1502   Signed and Held  HIV antibody (Routine Testing)  Once,   R     Signed and Held   Signed and Held  Lipid panel  Tomorrow morning,   R     Signed and Held   Signed and Held  CBC  Tomorrow morning,   R     Signed and Held   Signed and Held  Basic metabolic panel  Tomorrow morning,   R     Signed and Held          Vitals/Pain Today's Vitals   03/24/19 1500 03/24/19 1530 03/24/19 1600 03/24/19 1630  BP: (!) 136/59 134/67 138/62 139/62  Pulse: (!) 36 (!) 50 (!) 52 (!) 34  Resp: 14 (!) _0 Temp:      TempSrc:      SpO2: 100% 100% 100% 100%  Weight:      Height:      PainSc:        Isolation Precautions No active isolations  Medications Medications  heparin ADULT infusion 100 units/mL (25000 units/253m sodium chloride 0.45%) (600 Units/hr Intravenous New Bag/Given 03/24/19 1530)  sodium chloride flush (NS) 0.9 % injection 3 mL (has no administration in time range)  famotidine (PEPCID) IVPB 20 mg premix (has no administration in time range)  heparin bolus via infusion 3,200 Units (3,200 Units Intravenous Bolus from Bag 03/24/19 1532)  aspirin chewable tablet 324 mg (324 mg Oral Given 03/24/19 1603)    Mobility walks Low fall risk   Focused Assessments Cardiac Assessment Handoff:  Cardiac Rhythm: Sinus bradycardia No results found for: CKTOTAL, CKMB, CKMBINDEX, TROPONINI No results found for: DDIMER Does the Patient currently have chest pain? No      R Recommendations: See Admitting Provider Note  Report given to:   Additional Notes:  C/o heartburn sensation in chest for last few days. Went to her doctor today and  referred to ER after elevated troponin. Pt endorses SOB upon walking, reports syncope today "while having labs drawn". Started on heparin in ER, repeat troponin's continue to elevate. Cardiology saw patient in ER, schedule for heart cath. 20G to L AC-heparin, 22 R forearm saline lock. Alert and oriented X4, husband at bedside, ambulates well.

## 2019-03-24 NOTE — ED Notes (Signed)
Attempted to call report, informed nurse admitting another patient and will call back

## 2019-03-24 NOTE — Progress Notes (Signed)
[place] for heparin Indication: chest pain/ACS  Allergies  Allergen Reactions  . Formaldehyde Rash    Patient Measurements: Height: '4\' 11"'$  (149.9 cm) Weight: 120 lb (54.4 kg) IBW/kg (Calculated) : 43.2 Heparin Dosing Weight: 54 kg  Vital Signs: Temp: 98.6 F (37 C) (08/12 1924) Temp Source: Oral (08/12 1924) BP: 98/38 (08/12 2301) Pulse Rate: 57 (08/12 2301)  Labs: Recent Labs    03/24/19 1425 03/24/19 1445 03/24/19 1500 03/24/19 1621 03/24/19 1820 03/24/19 2006 03/24/19 2151  HGB 14.3  --   --   --   --   --   --   HCT 43.7  --   --   --   --   --   --   PLT 217  --   --   --   --   --   --   APTT  --  32  --   --   --   --   --   LABPROT  --  12.0  --   --   --   --   --   INR  --  0.9  --   --   --   --   --   HEPARINUNFRC  --   --   --   --   --   --  0.41  CREATININE  --   --  0.63  --   --   --  0.57  TROPONINIHS 1,548*  --   --  3,015* 4,313* 5,254*  --     Estimated Creatinine Clearance: 49.3 mL/min (by C-G formula based on SCr of 0.57 mg/dL).   Medical History: Past Medical History:  Diagnosis Date  . Breast cancer ([place]) 2011   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  . Carcinoma of overlapping sites of left breast in female, estrogen receptor positive ([place])   . DVT (deep venous thrombosis) ([place])   . DVT of leg (deep venous thrombosis) ([place]) 1970   while on birth control  . History of left breast cancer 03/25/2010   stage 1, clinical infiltrating carcinoma, tumor size 0.9 cm; margins clear, lymph nodes negative, ER/PR positive/HER2 negative  . History of radiation therapy   . History of spinal fracture   . Hyperlipidemia   . Hypertension   . Hypothyroidism   . Macular degeneration, wet ([place])   . Osteoporosis   . Personal history of radiation therapy 2011   left breast  . Psoriasis      Assessment: 68 year old female admitted with chest pain. No anticoagulation PTA. Pharmacy consulted for  heparin drip.  Goal of Therapy:  Heparin level 0.3-0.7 units/ml Monitor platelets by anticoagulation protocol: Yes   Plan:  08/12 @ 2200 HL 0.41 therapeutic. Will continue current rate and will recheck HL/CBC w/ am labs. Will continue to monitor.  Tobie Lords, PharmD Clinical Pharmacist 03/24/2019,11:28 PM

## 2019-03-24 NOTE — ED Notes (Signed)
Date and time results received: 03/24/19 1540 (use smartphrase ".now" to insert current time)  Test: troponin Critical Value: 1548  Name of Provider Notified: K. Paduchowski

## 2019-03-24 NOTE — ED Notes (Signed)
Family at bedside. 

## 2019-03-24 NOTE — ED Notes (Signed)
ED Provider at bedside. 

## 2019-03-25 ENCOUNTER — Inpatient Hospital Stay
Admit: 2019-03-25 | Discharge: 2019-03-25 | Disposition: A | Discharge status: Home / Self Care | Payer: Medicare Other | Attending: Internal Medicine | Admitting: Internal Medicine

## 2019-03-25 ENCOUNTER — Encounter
Admission: EM | Disposition: A | Discharge status: Home / Self Care | Payer: Self-pay | Source: Home / Self Care | Attending: Internal Medicine

## 2019-03-25 HISTORY — PX: LEFT HEART CATH AND CORONARY ANGIOGRAPHY: CATH118249

## 2019-03-25 HISTORY — PX: CORONARY STENT INTERVENTION: CATH118234

## 2019-03-25 LAB — BASIC METABOLIC PANEL
Anion gap: 8 (ref 5–15)
BUN: 11 mg/dL (ref 8–23)
CO2: 23 mmol/L (ref 22–32)
Calcium: 7.9 mg/dL — ABNORMAL LOW (ref 8.9–10.3)
Chloride: 108 mmol/L (ref 98–111)
Creatinine, Ser: 0.71 mg/dL (ref 0.44–1.00)
GFR calc Af Amer: >60 mL/min (ref >60)
GFR calc non Af Amer: >60 mL/min (ref >60)
Glucose, Bld: 101 mg/dL — ABNORMAL HIGH (ref 70–99)
Potassium: 4.3 mmol/L (ref 3.5–5.1)
Sodium: 139 mmol/L (ref 135–145)

## 2019-03-25 LAB — CBC
HCT: 35.2 % — ABNORMAL LOW (ref 36.0–46.0)
Hemoglobin: 11.4 g/dL — ABNORMAL LOW (ref 12.0–15.0)
MCH: 30.8 pg (ref 26.0–34.0)
MCHC: 32.4 g/dL (ref 30.0–36.0)
MCV: 95.1 fL (ref 80.0–100.0)
Platelets: 201 10*3/uL (ref 150–400)
RBC: 3.7 MIL/uL — ABNORMAL LOW (ref 3.87–5.11)
RDW: 13.2 % (ref 11.5–15.5)
WBC: 8.9 10*3/uL (ref 4.0–10.5)
nRBC: 0 % (ref 0.0–0.2)

## 2019-03-25 LAB — ECHOCARDIOGRAM COMPLETE
Height: 59 in
Weight: 1920 oz

## 2019-03-25 LAB — LIPID PANEL
Cholesterol: 120 mg/dL (ref 0–200)
HDL: 38 mg/dL — ABNORMAL LOW (ref >40)
LDL Cholesterol: 62 mg/dL (ref 0–99)
Total CHOL/HDL Ratio: 3.2 RATIO
Triglycerides: 102 mg/dL (ref <150)
VLDL: 20 mg/dL (ref 0–40)

## 2019-03-25 LAB — HEPARIN LEVEL (UNFRACTIONATED): Heparin Unfractionated: 0.31 IU/mL (ref 0.30–0.70)

## 2019-03-25 LAB — POCT ACTIVATED CLOTTING TIME: Activated Clotting Time: 329 seconds

## 2019-03-25 SURGERY — LEFT HEART CATH AND CORONARY ANGIOGRAPHY
Anesthesia: Moderate Sedation

## 2019-03-25 MED ORDER — CLOPIDOGREL BISULFATE 75 MG PO TABS
ORAL_TABLET | ORAL | Status: DC | PRN
  Administered 2019-03-25: 600 mg via ORAL

## 2019-03-25 MED ORDER — SODIUM CHLORIDE 0.9 % WEIGHT BASED INFUSION: 1.0000 mL/kg/h | INTRAVENOUS | Status: AC

## 2019-03-25 MED ORDER — FENTANYL CITRATE (PF) 100 MCG/2ML IJ SOLN
INTRAMUSCULAR | Status: DC | PRN
  Administered 2019-03-25 (×2): 25 ug via INTRAVENOUS

## 2019-03-25 MED ORDER — CLOPIDOGREL BISULFATE 75 MG PO TABS
75.0000 mg | ORAL_TABLET | Freq: Every day | ORAL | Status: DC
  Administered 2019-03-26: 75 mg via ORAL
  Filled 2019-03-25: qty 1

## 2019-03-25 MED ORDER — LABETALOL HCL 5 MG/ML IV SOLN: 10.0000 mg | INTRAVENOUS | Status: AC | PRN

## 2019-03-25 MED ORDER — SODIUM CHLORIDE 0.9 % IV SOLN: 250.0000 mL | INTRAVENOUS | Status: DC | PRN

## 2019-03-25 MED ORDER — SODIUM CHLORIDE 0.9 % IV SOLN
INTRAVENOUS | Status: AC | PRN
  Administered 2019-03-25: 250 mL via INTRAVENOUS

## 2019-03-25 MED ORDER — ROSUVASTATIN CALCIUM 10 MG PO TABS
20.0000 mg | ORAL_TABLET | Freq: Every day | ORAL | Status: DC
  Administered 2019-03-25: 20 mg via ORAL
  Filled 2019-03-25: qty 1
  Filled 2019-03-25: qty 2

## 2019-03-25 MED ORDER — SODIUM CHLORIDE 0.9% FLUSH
3.0000 mL | Freq: Two times a day (BID) | INTRAVENOUS | Status: DC
  Administered 2019-03-26: 3 mL via INTRAVENOUS

## 2019-03-25 MED ORDER — IOHEXOL 300 MG/ML  SOLN
INTRAMUSCULAR | Status: DC | PRN
  Administered 2019-03-25: 14:00:00 75 mL via INTRA_ARTERIAL

## 2019-03-25 MED ORDER — MIDAZOLAM HCL 2 MG/2ML IJ SOLN
INTRAMUSCULAR | Status: DC | PRN
  Administered 2019-03-25: 1 mg via INTRAVENOUS

## 2019-03-25 MED ORDER — IOHEXOL 300 MG/ML  SOLN
INTRAMUSCULAR | Status: DC | PRN
  Administered 2019-03-25: 13:00:00 180 mL via INTRAVENOUS

## 2019-03-25 MED ORDER — BIVALIRUDIN BOLUS VIA INFUSION - CUPID
INTRAVENOUS | Status: DC | PRN
  Administered 2019-03-25: 13:00:00 40.8 mg via INTRAVENOUS

## 2019-03-25 MED ORDER — FENTANYL CITRATE (PF) 100 MCG/2ML IJ SOLN
INTRAMUSCULAR | Status: AC
  Filled 2019-03-25: qty 2

## 2019-03-25 MED ORDER — BIVALIRUDIN TRIFLUOROACETATE 250 MG IV SOLR
INTRAVENOUS | Status: AC
  Filled 2019-03-25: qty 250

## 2019-03-25 MED ORDER — ACETAMINOPHEN 325 MG PO TABS: 650.0000 mg | ORAL_TABLET | ORAL | Status: DC | PRN

## 2019-03-25 MED ORDER — HEPARIN (PORCINE) IN NACL 1000-0.9 UT/500ML-% IV SOLN
INTRAVENOUS | Status: DC | PRN
  Administered 2019-03-25: 1000 mL

## 2019-03-25 MED ORDER — SODIUM CHLORIDE 0.9 % IV SOLN
INTRAVENOUS | Status: AC | PRN
  Administered 2019-03-25: 13:00:00 1.75 mg/kg/h via INTRAVENOUS

## 2019-03-25 MED ORDER — HEPARIN (PORCINE) IN NACL 1000-0.9 UT/500ML-% IV SOLN
INTRAVENOUS | Status: AC
  Filled 2019-03-25: qty 1000

## 2019-03-25 MED ORDER — NITROGLYCERIN 5 MG/ML IV SOLN
INTRAVENOUS | Status: AC
  Filled 2019-03-25: qty 10

## 2019-03-25 MED ORDER — ACETAMINOPHEN 325 MG PO TABS
ORAL_TABLET | ORAL | Status: AC
  Filled 2019-03-25: qty 2

## 2019-03-25 MED ORDER — SODIUM CHLORIDE 0.9% FLUSH: 3.0000 mL | INTRAVENOUS | Status: DC | PRN

## 2019-03-25 MED ORDER — ASPIRIN 81 MG PO CHEW: 81.0000 mg | CHEWABLE_TABLET | Freq: Every day | ORAL | Status: DC

## 2019-03-25 MED ORDER — MIDAZOLAM HCL 2 MG/2ML IJ SOLN
INTRAMUSCULAR | Status: AC
  Filled 2019-03-25: qty 2

## 2019-03-25 MED ORDER — MIDAZOLAM HCL 2 MG/2ML IJ SOLN
INTRAMUSCULAR | Status: DC | PRN
  Administered 2019-03-25: 0.5 mg via INTRAVENOUS

## 2019-03-25 MED ORDER — HYDRALAZINE HCL 20 MG/ML IJ SOLN: 10.0000 mg | INTRAMUSCULAR | Status: AC | PRN

## 2019-03-25 MED ORDER — CLOPIDOGREL BISULFATE 300 MG PO TABS
ORAL_TABLET | ORAL | Status: AC
  Filled 2019-03-25: qty 2

## 2019-03-25 MED ORDER — ONDANSETRON HCL 4 MG/2ML IJ SOLN: 4.0000 mg | Freq: Four times a day (QID) | INTRAMUSCULAR | Status: DC | PRN

## 2019-03-25 SURGICAL SUPPLY — 19 items
BALLN TREK RX 2.25X12 (BALLOONS) ×3
BALLOON TREK RX 2.25X12 (BALLOONS) ×1 IMPLANT
CATH INFINITI 5FR ANG PIGTAIL (CATHETERS) ×3 IMPLANT
CATH INFINITI 5FR JL4 (CATHETERS) ×3 IMPLANT
CATH INFINITI JR4 5F (CATHETERS) ×3 IMPLANT
CATH VISTA GUIDE 6FR JR4 SH (CATHETERS) ×3 IMPLANT
CATH VISTA GUIDE 6FR XB3 SH (CATHETERS) ×3 IMPLANT
CATH VISTA GUIDE 6FR XB3.5 (CATHETERS) ×3 IMPLANT
DEVICE CLOSURE MYNXGRIP 6/7F (Vascular Products) ×3 IMPLANT
DEVICE INFLAT 30 PLUS (MISCELLANEOUS) ×3 IMPLANT
GLIDESHEATH SLEND SS 6F .021 (SHEATH) IMPLANT
KIT MANI 3VAL PERCEP (MISCELLANEOUS) ×3 IMPLANT
NEEDLE PERC 18GX7CM (NEEDLE) ×3 IMPLANT
PACK CARDIAC CATH (CUSTOM PROCEDURE TRAY) ×3 IMPLANT
SHEATH AVANTI 6FR X 11CM (SHEATH) ×3 IMPLANT
STENT RESOLUTE ONYX 2.25X12 (Permanent Stent) ×3 IMPLANT
STENT RESOLUTE ONYX 2.5X15 (Permanent Stent) ×3 IMPLANT
WIRE ASAHI PROWATER 180CM (WIRE) ×3 IMPLANT
WIRE GUIDERIGHT .035X150 (WIRE) ×3 IMPLANT

## 2019-03-25 NOTE — Progress Notes (Signed)
*  PRELIMINARY RESULTS* Echocardiogram 2D Echocardiogram has been performed.  Katelyn Manning 03/25/2019, 8:47 AM

## 2019-03-25 NOTE — Progress Notes (Signed)
[place] for heparin Indication: chest pain/ACS  Allergies  Allergen Reactions  . Formaldehyde Rash    Patient Measurements: Height: '4\' 11"'$  (149.9 cm) Weight: 120 lb (54.4 kg) IBW/kg (Calculated) : 43.2 Heparin Dosing Weight: 54 kg  Vital Signs: Temp: 97.5 F (36.4 C) (08/13 0454) Temp Source: Oral (08/13 0454) BP: 96/41 (08/13 0454) Pulse Rate: 57 (08/13 0454)  Labs: Recent Labs    03/24/19 1425 03/24/19 1445 03/24/19 1500 03/24/19 1621 03/24/19 1820 03/24/19 2006 03/24/19 2151 03/25/19 0512  HGB 14.3  --   --   --   --   --   --  11.4*  HCT 43.7  --   --   --   --   --   --  35.2*  PLT 217  --   --   --   --   --   --  201  APTT  --  32  --   --   --   --   --   --   LABPROT  --  12.0  --   --   --   --   --   --   INR  --  0.9  --   --   --   --   --   --   HEPARINUNFRC  --   --   --   --   --   --  0.41 0.31  CREATININE  --   --  0.63  --   --   --  0.57 0.71  TROPONINIHS 1,548*  --   --  3,015* 4,313* 5,254*  --   --     Estimated Creatinine Clearance: 49.3 mL/min (by C-G formula based on SCr of 0.71 mg/dL).   Medical History: Past Medical History:  Diagnosis Date  . Breast cancer ([place]) 2011   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  . Carcinoma of overlapping sites of left breast in female, estrogen receptor positive ([place])   . DVT (deep venous thrombosis) ([place])   . DVT of leg (deep venous thrombosis) ([place]) 1970   while on birth control  . History of left breast cancer 03/25/2010   stage 1, clinical infiltrating carcinoma, tumor size 0.9 cm; margins clear, lymph nodes negative, ER/PR positive/HER2 negative  . History of radiation therapy   . History of spinal fracture   . Hyperlipidemia   . Hypertension   . Hypothyroidism   . Macular degeneration, wet ([place])   . Osteoporosis   . Personal history of radiation therapy 2011   left breast  . Psoriasis      Assessment: 68 year old female admitted  with chest pain. No anticoagulation PTA. Pharmacy consulted for heparin drip.  Goal of Therapy:  Heparin level 0.3-0.7 units/ml Monitor platelets by anticoagulation protocol: Yes   Plan:  08/13 @ 0500 HL 0.31 therapeutic, but trended down, will recheck another HL @ 1300 to ensure patient doesn't go subtherapeutic considering her trops are still rising. CBC trended down will continue to monitor.  Tobie Lords, PharmD Clinical Pharmacist 03/25/2019,7:06 AM

## 2019-03-25 NOTE — Progress Notes (Signed)
[facility] Encounter Note  Patient: Katelyn Manning / Admit Date: 03/24/2019 / Date of Encounter: 03/25/2019, 12:31 PM   Subjective: Patient completely pain-free overnight into today.  No further evidence of shortness of breath or chest pain.  No rhythm disturbances.  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 rate 19, height 4\' 11"  (1.499 m), weight 54.4 kg, SpO2 97 %. Body mass index is 24.24 kg/m. General: Well developed, well nourished, in no acute distress. Head: Normocephalic, atraumatic, sclera non-icteric, no xanthomas, nares are without discharge. Neck: No apparent masses Lungs: Normal respirations with no wheezes, no rhonchi, no rales , no crackles   Heart: Regular rate and rhythm, normal S1 S2, no murmur, no rub, no gallop, PMI is normal size and placement, carotid upstroke normal without bruit, jugular venous pressure normal Abdomen: Soft, non-tender, non-distended with normoactive bowel sounds. No hepatosplenomegaly. Abdominal aorta is normal size without bruit Extremities: No edema, no clubbing, no cyanosis, no ulcers,  Peripheral: 2+ radial, 2+ femoral, 2+ dorsal pedal pulses Neuro: Alert and oriented. Moves all extremities spontaneously. Psych:  Responds to questions appropriately with a normal  affect.   Intake/Output Summary (Last 24 hours) at 03/25/2019 1231 Last data filed at 03/25/2019 0740 Gross per 24 hour  Intake 1384.4 ml  Output 400 ml  Net 984.4 ml    Inpatient Medications:  . aspirin  81 mg Oral Pre-Cath  . [MAR Hold] aspirin EC  81 mg Oral Daily  . [MAR Hold] levothyroxine  50 mcg Oral QAC breakfast  . [MAR Hold] simvastatin  40 mg Oral q1800  . [MAR Hold] sodium chloride flush  3 mL Intravenous Q12H   Infusions:  . sodium chloride    . sodium chloride 125 mL/hr at 03/25/19 0706  . sodium chloride 250 mL (03/25/19 1204)  . sodium chloride    . [MAR Hold] famotidine (PEPCID) IV 20 mg (03/25/19 1007)  . heparin 600 Units/hr (03/24/19 1530)    Labs: Recent Labs    03/24/19 2151 03/25/19 0512  NA 137 139  K 4.1 4.3  CL 106 108  CO2 22 23  GLUCOSE 91 101*  BUN 12 11  CREATININE 0.57 0.71  CALCIUM 8.5* 7.9*  MG 1.9  --    Recent Labs    03/24/19 1500 03/24/19 2151  AST 48* 57*  ALT 20 19  ALKPHOS 70 59  BILITOT 0.3 0.5  PROT 6.5 5.1*  ALBUMIN 3.7 2.8*   Recent Labs    03/24/19 1425 03/25/19 0512  WBC 9.9 8.9  NEUTROABS 7.3  --   HGB 14.3 11.4*  HCT 43.7 35.2*  MCV 95.6 95.1  PLT 217 201   No results for input(s): CKTOTAL, CKMB, TROPONINI in the last 72 hours. Invalid input(s): POCBNP No results for input(s): HGBA1C in the last 72 hours.   Weights: Filed Weights   03/24/19 2331 03/25/19 0200 03/25/19 0454  Weight: 55.5 kg 55.5 kg 54.4 kg     Radiology/Studies:  Dg Chest  Portable 1 View  Result Date: 03/24/2019 CLINICAL DATA:  Chest pain EXAM: PORTABLE CHEST 1 VIEW COMPARISON:  None. FINDINGS: Coarse mildly reticular lung opacity. No focal consolidation or effusion. Borderline heart size. No pneumothorax. IMPRESSION: No active disease. Coarse pulmonary opacity, suspect chronic change. Electronically Signed   By: Donavan Foil M.D.   On: 03/24/2019 15:22     Assessment and Recommendation  67 y.o. female with hypertension  hyperlipidemia having acute non-ST elevation myocardial infarction 1.  PCI and stent placement of right coronary artery and left circumflex artery 2.  Dual antiplatelet therapy for 12 months 3.  Cardiac rehab 4.  High intensity cholesterol therapy 5.  Abstain from beta-blocker due to bradycardia 6.  ACE inhibitor if able for hypertension control and myocardial infarction as long as patient is not hypotensive  Signed, Serafina Royals M.D. FACC

## 2019-03-25 NOTE — Progress Notes (Signed)
   Patient was consulted on for NSTEMI by [facility] Cardiology yesterday. Please refer to 8/12 consult note.    Signed, Arvil Chaco, PA-C 03/25/2019, 9:32 AM Pager [phone]

## 2019-03-25 NOTE — Progress Notes (Signed)
[place] at [facility]                                                                                                                                                                                  Patient Demographics   Katelyn Manning, is a 68 y.o. female, DOB - 28-Oct-1950, LDJ:[id number]  Admit date - 03/24/2019   Admitting Physician Nicholes Mango, MD  Outpatient Primary MD for the patient is Tama High III, MD   LOS - 1  Subjective:  Patient denying any chest pain status post stent   Review of Systems:   CONSTITUTIONAL: No documented fever. No fatigue, weakness. No weight gain, no weight loss.  EYES: No blurry or double vision.  ENT: No tinnitus. No postnasal drip. No redness of the oropharynx.  RESPIRATORY: No cough, no wheeze, no hemoptysis. No dyspnea.  CARDIOVASCULAR: No chest pain. No orthopnea. No palpitations. No syncope.  GASTROINTESTINAL: No nausea, no vomiting or diarrhea. No abdominal pain. No melena or hematochezia.  GENITOURINARY: No dysuria or hematuria.  ENDOCRINE: No polyuria or nocturia. No heat or cold intolerance.  HEMATOLOGY: No anemia. No bruising. No bleeding.  INTEGUMENTARY: No rashes. No lesions.  MUSCULOSKELETAL: No arthritis. No swelling. No gout.  NEUROLOGIC: No numbness, tingling, or ataxia. No seizure-type activity.  PSYCHIATRIC: No anxiety. No insomnia. No ADD.    Vitals:   Vitals:   03/25/19 1430 03/25/19 1445 03/25/19 1500 03/25/19 1530  BP: (!) 167/57  (!) 137/58 123/63  Pulse: 65 64 64 71  Resp: 13 18 17 17   Temp:      TempSrc:      SpO2: 97% 100% 97% 99%  Weight:      Height:        Wt Readings from Last 3 Encounters:  03/25/19 54.4 kg  07/01/18 59.9 kg  06/11/17 57.2 kg     Intake/Output Summary (Last 24 hours) at 03/25/2019 1543 Last data filed at 03/25/2019 0740 Gross per 24 hour  Intake 1384.4 ml  Output 400 ml  Net 984.4 ml    Physical Exam:   GENERAL: Pleasant-appearing in no  apparent distress.  HEAD, EYES, EARS, NOSE AND THROAT: Atraumatic, normocephalic. Extraocular muscles are intact. Pupils equal and reactive to light. Sclerae anicteric. No conjunctival injection. No oro-pharyngeal erythema.  NECK: Supple. There is no jugular venous distention. No bruits, no lymphadenopathy, no thyromegaly.  HEART: Regular rate and rhythm,. No murmurs, no rubs, no clicks.  LUNGS: Clear to auscultation bilaterally. No rales or rhonchi. No wheezes.  ABDOMEN: Soft, flat, nontender, nondistended. Has good bowel sounds. No hepatosplenomegaly appreciated.  EXTREMITIES: No evidence of any cyanosis, clubbing, or peripheral edema.  +  2 pedal and radial pulses bilaterally.  NEUROLOGIC: The patient is alert, awake, and oriented x3 with no focal motor or sensory deficits appreciated bilaterally.  SKIN: Moist and warm with no rashes appreciated.  Psych: Not anxious, depressed LN: No inguinal LN enlargement    Antibiotics   Anti-infectives (From admission, onward)   None      Medications   Scheduled Meds: . acetaminophen      . aspirin  81 mg Oral Pre-Cath  . aspirin  81 mg Oral Daily  . [MAR Hold] aspirin EC  81 mg Oral Daily  . [START ON 03/26/2019] clopidogrel  75 mg Oral Q breakfast  . [MAR Hold] levothyroxine  50 mcg Oral QAC breakfast  . rosuvastatin  20 mg Oral q1800  . [MAR Hold] sodium chloride flush  3 mL Intravenous Q12H  . sodium chloride flush  3 mL Intravenous Q12H   Continuous Infusions: . sodium chloride    . sodium chloride 125 mL/hr at 03/25/19 0706  . sodium chloride    . sodium chloride    . sodium chloride    . [MAR Hold] famotidine (PEPCID) IV 20 mg (03/25/19 1007)  . heparin 600 Units/hr (03/24/19 1530)   PRN Meds:.sodium chloride, sodium chloride, [MAR Hold] acetaminophen, acetaminophen, hydrALAZINE, labetalol, [MAR Hold] nitroGLYCERIN, [MAR Hold] ondansetron (ZOFRAN) IV, ondansetron (ZOFRAN) IV, sodium chloride flush, sodium chloride flush   Data  Review:   Micro Results Recent Results (from the past 240 hour(s))  SARS Coronavirus 2 [facility] order, Performed in [facility] hospital lab) Nasopharyngeal Nasopharyngeal Swab     Status: None   Collection Time: 03/24/19  2:25 PM   Specimen: Nasopharyngeal Swab  Result Value Ref Range Status   SARS Coronavirus 2 NEGATIVE NEGATIVE Final    Comment: (NOTE) If result is NEGATIVE SARS-CoV-2 target nucleic acids are NOT DETECTED. The SARS-CoV-2 RNA is generally detectable in upper and lower  respiratory specimens during the acute phase of infection. The lowest  concentration of SARS-CoV-2 viral copies this assay can detect is 250  copies / mL. A negative result does not preclude SARS-CoV-2 infection  and should not be used as the sole basis for treatment or other  patient management decisions.  A negative result may occur with  improper specimen collection / handling, submission of specimen other  than nasopharyngeal swab, presence of viral mutation(s) within the  areas targeted by this assay, and inadequate number of viral copies  (<250 copies / mL). A negative result must be combined with clinical  observations, patient history, and epidemiological information. If result is POSITIVE SARS-CoV-2 target nucleic acids are DETECTED. The SARS-CoV-2 RNA is generally detectable in upper and lower  respiratory specimens dur ing the acute phase of infection.  Positive  results are indicative of active infection with SARS-CoV-2.  Clinical  correlation with patient history and other diagnostic information is  necessary to determine patient infection status.  Positive results do  not rule out bacterial infection or co-infection with other viruses. If result is PRESUMPTIVE POSTIVE SARS-CoV-2 nucleic acids MAY BE PRESENT.   A presumptive positive result was obtained on the submitted specimen  and confirmed on repeat testing.  While 2019 novel coronavirus  (SARS-CoV-2) nucleic acids may be present  in the submitted sample  additional confirmatory testing may be necessary for epidemiological  and / or clinical management purposes  to differentiate between  SARS-CoV-2 and other Sarbecovirus currently known to infect humans.  If clinically indicated additional testing with an alternate test  methodology [phone]) is advised. The SARS-CoV-2 RNA is generally  detectable in upper and lower respiratory sp ecimens during the acute  phase of infection. The expected result is Negative. Fact Sheet for Patients:  StrictlyIdeas.no Fact Sheet for Healthcare Providers: BankingDealers.co.za This test is not yet approved or cleared by the [place] FDA and has been authorized for detection and/or diagnosis of SARS-CoV-2 by FDA under an Emergency Use Authorization (EUA).  This EUA will remain in effect (meaning this test can be used) for the duration of the COVID-19 declaration under Section 564(b)(1) of the Act, 21 U.S.C. section 360bbb-3(b)(1), unless the authorization is terminated or revoked sooner. Performed at [facility], [address] [place], [place] [address]     Radiology Reports Dg Chest Portable 1 View  Result Date: 03/24/2019 CLINICAL DATA:  Chest pain EXAM: PORTABLE CHEST 1 VIEW COMPARISON:  None. FINDINGS: Coarse mildly reticular lung opacity. No focal consolidation or effusion. Borderline heart size. No pneumothorax. IMPRESSION: No active disease. Coarse pulmonary opacity, suspect chronic change. Electronically Signed   By: Donavan Foil M.D.   On: 03/24/2019 15:22     CBC Recent Labs  Lab 03/24/19 1425 03/25/19 0512  WBC 9.9 8.9  HGB 14.3 11.4*  HCT 43.7 35.2*  PLT 217 201  MCV 95.6 95.1  MCH 31.3 30.8  MCHC 32.7 32.4  RDW 13.0 13.2  LYMPHSABS 1.6  --   MONOABS 0.9  --   EOSABS 0.1  --   BASOSABS 0.1  --     Chemistries  Recent Labs  Lab 03/24/19 1500 03/24/19 2151 03/25/19 0512  NA 134* 137  139  K 4.1 4.1 4.3  CL 100 106 108  CO2 26 22 23   GLUCOSE 111* 91 101*  BUN 14 12 11   CREATININE 0.63 0.57 0.71  CALCIUM 9.6 8.5* 7.9*  MG  --  1.9  --   AST 48* 57*  --   ALT 20 19  --   ALKPHOS 70 59  --   BILITOT 0.3 0.5  --    ------------------------------------------------------------------------------------------------------------------ estimated creatinine clearance is 49.3 mL/min (by C-G formula based on SCr of 0.71 mg/dL). ------------------------------------------------------------------------------------------------------------------ No results for input(s): HGBA1C in the last 72 hours. ------------------------------------------------------------------------------------------------------------------ Recent Labs    03/25/19 0512  CHOL 120  HDL 38*  LDLCALC 62  TRIG 102  CHOLHDL 3.2   ------------------------------------------------------------------------------------------------------------------ No results for input(s): TSH, T4TOTAL, T3FREE, THYROIDAB in the last 72 hours.  Invalid input(s): FREET3 ------------------------------------------------------------------------------------------------------------------ No results for input(s): VITAMINB12, FOLATE, FERRITIN, TIBC, IRON, RETICCTPCT in the last 72 hours.  Coagulation profile Recent Labs  Lab 03/24/19 1445  INR 0.9    No results for input(s): DDIMER in the last 72 hours.  Cardiac Enzymes No results for input(s): CKMB, TROPONINI, MYOGLOBIN in the last 168 hours.  Invalid input(s): CK ------------------------------------------------------------------------------------------------------------------ Invalid input(s): POCBNP    Assessment & Plan   #Non-STEMI Status post 2 stents Continue aspirin, Plavix Continue Crestor  #Hypothyroidism continue Synthyroid  #Sinus bradycardia hold rate limiting drugs including beta-blocker  #Essential hypertension-continue current regimen  #GERD  Pepcid      Code Status Orders  (From admission, onward)         Start     Ordered   03/25/19 1436  Full code  Continuous     03/25/19 1435        Code Status History    Date Active Date Inactive Code Status Order ID Comments User Context   03/24/2019 1811 03/25/2019 1435  Full Code [id number]  Nicholes Mango, MD Inpatient   Advance Care Planning Activity    Advance Directive Documentation     Most Recent Value  Type of Advance Directive  Healthcare Power of [place], Living will  Pre-existing out of facility DNR order (yellow form or pink MOST form)  -  "MOST" Form in Place?  -           Consults  cards  DVT Prophylaxis   heparin  Lab Results  Component Value Date   PLT 201 03/25/2019     Time Spent in minutes   40min  Greater than 50% of time spent in care coordination and counseling patient regarding the condition and plan of care.   Dustin Flock M.D on 03/25/2019 at 3:43 PM  Between 7am to 6pm - Pager - [phone]  After 6pm go to [email or web address] - Proofreader  [employer]   Office  [phone]

## 2019-03-25 NOTE — Progress Notes (Signed)
[facility] Encounter Note  Patient: Katelyn Manning / Admit Date: 03/24/2019 / Date of Encounter: 03/25/2019, 3:59 PM   Subjective: Patient completely pain-free overnight into today.  No further evidence of shortness of breath or chest pain.  No rhythm disturbances.  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 rate 17, height 4\' 11"  (1.499 m), weight 54.4 kg, SpO2 99 %. Body mass index is 24.24 kg/m. General: Well developed, well nourished, in no acute distress. Head: Normocephalic, atraumatic, sclera non-icteric, no xanthomas, nares are without discharge. Neck: No apparent masses Lungs: Normal respirations with no wheezes, no rhonchi, no rales , no crackles   Heart: Regular rate and rhythm, normal S1 S2, no murmur, no rub, no gallop, PMI is normal size and placement, carotid upstroke normal without bruit, jugular venous pressure normal Abdomen: Soft, non-tender, non-distended with normoactive bowel sounds. No hepatosplenomegaly. Abdominal aorta is normal size without bruit Extremities: No edema, no clubbing, no cyanosis, no ulcers,  Peripheral: 2+ radial, 2+ femoral, 2+ dorsal pedal pulses Neuro: Alert and  oriented. Moves all extremities spontaneously. Psych:  Responds to questions appropriately with a normal affect.   Intake/Output Summary (Last 24 hours) at 03/25/2019 1559 Last data filed at 03/25/2019 0740 Gross per 24 hour  Intake 1384.4 ml  Output 400 ml  Net 984.4 ml    Inpatient Medications:  . acetaminophen      . aspirin  81 mg Oral Pre-Cath  . aspirin  81 mg Oral Daily  . [MAR Hold] aspirin EC  81 mg Oral Daily  . [START ON 03/26/2019] clopidogrel  75 mg Oral Q breakfast  . [MAR Hold] levothyroxine  50 mcg Oral QAC breakfast  . rosuvastatin  20 mg Oral q1800  . [MAR Hold] sodium chloride flush  3 mL Intravenous Q12H  . sodium chloride flush  3 mL Intravenous Q12H   Infusions:  . sodium chloride    . sodium chloride 125 mL/hr at 03/25/19 0706  . sodium chloride    . sodium chloride    . sodium chloride    . [MAR Hold] famotidine (PEPCID) IV 20 mg (03/25/19 1007)    Labs: Recent Labs    03/24/19 2151 03/25/19 0512  NA 137 139  K 4.1 4.3  CL 106 108  CO2 22 23  GLUCOSE 91 101*  BUN 12 11  CREATININE 0.57 0.71  CALCIUM 8.5* 7.9*  MG 1.9  --    Recent Labs    03/24/19 1500 03/24/19 2151  AST 48* 57*  ALT 20 19  ALKPHOS 70 59  BILITOT 0.3 0.5  PROT 6.5 5.1*  ALBUMIN 3.7 2.8*   Recent Labs    03/24/19 1425 03/25/19 0512  WBC 9.9 8.9  NEUTROABS 7.3  --   HGB 14.3 11.4*  HCT 43.7 35.2*  MCV 95.6 95.1  PLT 217 201   No results for input(s): CKTOTAL, CKMB, TROPONINI  in the last 72 hours. Invalid input(s): POCBNP No results for input(s): HGBA1C in the last 72 hours.   Weights: Filed Weights   03/24/19 2331 03/25/19 0200 03/25/19 0454  Weight: 55.5 kg 55.5 kg 54.4 kg     Radiology/Studies:  Dg Chest Portable 1 View  Result Date: 03/24/2019 CLINICAL DATA:  Chest pain EXAM: PORTABLE CHEST 1 VIEW COMPARISON:  None. FINDINGS: Coarse mildly reticular lung opacity. No focal consolidation or effusion. Borderline heart size. No pneumothorax.  IMPRESSION: No active disease. Coarse pulmonary opacity, suspect chronic change. Electronically Signed   By: Donavan Foil M.D.   On: 03/24/2019 15:22     Assessment and Recommendation  68 y.o. female with hypertension hyperlipidemia having acute non-ST elevation myocardial infarction now with successful PCI and stent placement of right coronary artery and left circumflex artery stenosis 1.  No further cardiac intervention after successful PCI and stent placement as per above 2.  Dual antiplatelet therapy for 12 months 3.  Cardiac rehab 4.  High intensity cholesterol therapy 5.  Abstain from beta-blocker due to bradycardia unless improved over the next 12 hours 6.  ACE inhibitor if able for hypertension control and myocardial infarction as long as patient is not hypotensive 7.  Okay for discharge to home tomorrow if ambulating well with no further significant symptoms on appropriate medication management listed above  Signed, Serafina Royals M.D. FACC

## 2019-03-26 LAB — BASIC METABOLIC PANEL
Anion gap: 5 (ref 5–15)
BUN: 7 mg/dL — ABNORMAL LOW (ref 8–23)
CO2: 23 mmol/L (ref 22–32)
Calcium: 7.7 mg/dL — ABNORMAL LOW (ref 8.9–10.3)
Chloride: 111 mmol/L (ref 98–111)
Creatinine, Ser: 0.58 mg/dL (ref 0.44–1.00)
GFR calc Af Amer: >60 mL/min (ref >60)
GFR calc non Af Amer: >60 mL/min (ref >60)
Glucose, Bld: 106 mg/dL — ABNORMAL HIGH (ref 70–99)
Potassium: 3.7 mmol/L (ref 3.5–5.1)
Sodium: 139 mmol/L (ref 135–145)

## 2019-03-26 LAB — CBC
HCT: 34.5 % — ABNORMAL LOW (ref 36.0–46.0)
Hemoglobin: 11.3 g/dL — ABNORMAL LOW (ref 12.0–15.0)
MCH: 31.1 pg (ref 26.0–34.0)
MCHC: 32.8 g/dL (ref 30.0–36.0)
MCV: 95 fL (ref 80.0–100.0)
Platelets: 201 10*3/uL (ref 150–400)
RBC: 3.63 MIL/uL — ABNORMAL LOW (ref 3.87–5.11)
RDW: 13.4 % (ref 11.5–15.5)
WBC: 9 10*3/uL (ref 4.0–10.5)
nRBC: 0 % (ref 0.0–0.2)

## 2019-03-26 LAB — HIV ANTIBODY (ROUTINE TESTING W REFLEX): HIV Screen 4th Generation wRfx: NONREACTIVE

## 2019-03-26 MED ORDER — SIMVASTATIN 80 MG PO TABS: 80.0000 mg | ORAL_TABLET | Freq: Every evening | ORAL | 11 refills | Status: AC

## 2019-03-26 MED ORDER — CLOPIDOGREL BISULFATE 75 MG PO TABS: 75.0000 mg | ORAL_TABLET | Freq: Every day | ORAL | 3 refills | Status: AC

## 2019-03-26 MED ORDER — CARVEDILOL 6.25 MG PO TABS: 6.2500 mg | ORAL_TABLET | Freq: Two times a day (BID) | ORAL | 11 refills | Status: AC

## 2019-03-26 NOTE — Plan of Care (Signed)
  Problem: Clinical Measurements: Goal: Ability to maintain clinical measurements within normal limits will improve Outcome: Progressing Goal: Cardiovascular complication will be avoided Outcome: Progressing   Problem: Pain Managment: Goal: General experience of comfort will improve Outcome: Progressing   Problem: Skin Integrity: Goal: Risk for impaired skin integrity will decrease Outcome: Progressing   Problem: Cardiac: Goal: Ability to achieve and maintain adequate cardiovascular perfusion will improve Outcome: Progressing

## 2019-03-26 NOTE — Progress Notes (Signed)
Discharge instructions explained to pt and pts spouse/ verbalized an understanding/ iv and tele removed/ pt ambulated around nursing station/ tolerated well/ transported off unit via wheelchair.

## 2019-03-26 NOTE — Discharge Summary (Signed)
[facility] [place] [facility], 68 y.o., DOB [birth year], MRN [id number]. Admission date: 03/24/2019 Discharge Date 03/26/2019 Primary MD Adin [name], MD Admitting Physician Nicholes Mango, MD  Admission Diagnosis  NSTEMI (non-ST elevated myocardial infarction) [facility]) [I21.4]  Discharge Diagnosis   Active Problems:   NSTEMI (non-ST elevated myocardial infarction) [facility]) Hypothyroidism Sinus bradycardia Essential hypertension GERD     Hospital Course  Katelyn Manning  is a 68 y.o. female with a known history of hypertension, hyperlipidemia, hypothyroidism and a history of coronary artery disease just had stress test 2 weeks ago by Dr. Caryl Comes he is presenting to the ED with a chief complaint of heartburn.  Troponin is elevated at around 1500.  Patient was admitted for non-ST MI.  She underwent a cardiac catheterization patient had a RCA occlusion and mid circumflex occlusion that was stented.            Consults  cardiology  Significant Tests:  See full reports for all details     Dg Chest Portable 1 View  Result Date: 03/24/2019 CLINICAL DATA:  Chest pain EXAM: PORTABLE CHEST 1 VIEW COMPARISON:  None. FINDINGS: Coarse mildly reticular lung opacity. No focal consolidation or effusion. Borderline heart size. No pneumothorax. IMPRESSION: No active disease. Coarse pulmonary opacity, suspect chronic change. Electronically Signed   By: Donavan Foil M.D.   On: 03/24/2019 15:22       Today   Subjective:   Katelyn Manning patient doing well denies any chest pains or shortness of breath  Objective:   Blood pressure (!) 151/49, pulse 68, temperature 98.2 F (36.8 C), temperature source Oral, resp. rate 18, height 4\' 11"  (1.499 m), weight 56.8 kg, SpO2 99 %.  .  Intake/Output Summary (Last 24 hours) at 03/26/2019 1541 Last data filed at 03/26/2019 1100 Gross per 24 hour  Intake 320 ml  Output 1900 ml  Net -1580 ml    Exam VITAL SIGNS: Blood  pressure (!) 151/49, pulse 68, temperature 98.2 F (36.8 C), temperature source Oral, resp. rate 18, height 4\' 11"  (1.499 m), weight 56.8 kg, SpO2 99 %.  GENERAL:  68 y.o.-year-old patient lying in the bed with no acute distress.  EYES: Pupils equal, round, reactive to light and accommodation. No scleral icterus. Extraocular muscles intact.  HEENT: Head atraumatic, normocephalic. Oropharynx and nasopharynx clear.  NECK:  Supple, no jugular venous distention. No thyroid enlargement, no tenderness.  LUNGS: Normal breath sounds bilaterally, no wheezing, rales,rhonchi or crepitation. No use of accessory muscles of respiration.  CARDIOVASCULAR: S1, S2 normal. No murmurs, rubs, or gallops.  ABDOMEN: Soft, nontender, nondistended. Bowel sounds present. No organomegaly or mass.  EXTREMITIES: No pedal edema, cyanosis, or clubbing.  NEUROLOGIC: Cranial nerves II through XII are intact. Muscle strength 5/5 in all extremities. Sensation intact. Gait not checked.  PSYCHIATRIC: The patient is alert and oriented x 3.  SKIN: No obvious rash, lesion, or ulcer.   Data Review     CBC w Diff:  Lab Results  Component Value Date   WBC 9.0 03/26/2019   HGB 11.3 (L) 03/26/2019   HGB 14.5 05/11/2014   HCT 34.5 (L) 03/26/2019   HCT 44.6 05/11/2014   PLT 201 03/26/2019   PLT 259 05/11/2014   LYMPHOPCT 16 03/24/2019   LYMPHOPCT 19.6 05/11/2014   MONOPCT 9 03/24/2019   MONOPCT 10.0 05/11/2014   EOSPCT 1 03/24/2019   EOSPCT 1.2 05/11/2014   BASOPCT 1 03/24/2019   BASOPCT 0.5 05/11/2014  CMP:  Lab Results  Component Value Date   NA 139 03/26/2019   K 3.7 03/26/2019   CL 111 03/26/2019   CO2 23 03/26/2019   BUN 7 (L) 03/26/2019   CREATININE 0.58 03/26/2019   CREATININE 0.77 05/11/2014   PROT 5.1 (L) 03/24/2019   PROT 7.5 05/11/2014   ALBUMIN 2.8 (L) 03/24/2019   ALBUMIN 3.9 05/11/2014   BILITOT 0.5 03/24/2019   BILITOT 0.3 05/11/2014   ALKPHOS 59 03/24/2019   ALKPHOS 73 05/11/2014   AST 57  (H) 03/24/2019   AST 25 05/11/2014   ALT 19 03/24/2019   ALT 26 05/11/2014  .  Micro Results Recent Results (from the past 240 hour(s))  SARS Coronavirus 2 [facility] order, Performed in [facility] hospital lab) Nasopharyngeal Nasopharyngeal Swab     Status: None   Collection Time: 03/24/19  2:25 PM   Specimen: Nasopharyngeal Swab  Result Value Ref Range Status   SARS Coronavirus 2 NEGATIVE NEGATIVE Final    Comment: (NOTE) If result is NEGATIVE SARS-CoV-2 target nucleic acids are NOT DETECTED. The SARS-CoV-2 RNA is generally detectable in upper and lower  respiratory specimens during the acute phase of infection. The lowest  concentration of SARS-CoV-2 viral copies this assay can detect is 250  copies / mL. A negative result does not preclude SARS-CoV-2 infection  and should not be used as the sole basis for treatment or other  patient management decisions.  A negative result may occur with  improper specimen collection / handling, submission of specimen other  than nasopharyngeal swab, presence of viral mutation(s) within the  areas targeted by this assay, and inadequate number of viral copies  (<250 copies / mL). A negative result must be combined with clinical  observations, patient history, and epidemiological information. If result is POSITIVE SARS-CoV-2 target nucleic acids are DETECTED. The SARS-CoV-2 RNA is generally detectable in upper and lower  respiratory specimens dur ing the acute phase of infection.  Positive  results are indicative of active infection with SARS-CoV-2.  Clinical  correlation with patient history and other diagnostic information is  necessary to determine patient infection status.  Positive results do  not rule out bacterial infection or co-infection with other viruses. If result is PRESUMPTIVE POSTIVE SARS-CoV-2 nucleic acids MAY BE PRESENT.   A presumptive positive result was obtained on the submitted specimen  and confirmed on repeat testing.   While 2019 novel coronavirus  (SARS-CoV-2) nucleic acids may be present in the submitted sample  additional confirmatory testing may be necessary for epidemiological  and / or clinical management purposes  to differentiate between  SARS-CoV-2 and other Sarbecovirus currently known to infect humans.  If clinically indicated additional testing with an alternate test  methodology [phone]) is advised. The SARS-CoV-2 RNA is generally  detectable in upper and lower respiratory sp ecimens during the acute  phase of infection. The expected result is Negative. Fact Sheet for Patients:  StrictlyIdeas.no Fact Sheet for Healthcare Providers: BankingDealers.co.za This test is not yet approved or cleared by the [place] FDA and has been authorized for detection and/or diagnosis of SARS-CoV-2 by FDA under an Emergency Use Authorization (EUA).  This EUA will remain in effect (meaning this test can be used) for the duration of the COVID-19 declaration under Section 564(b)(1) of the Act, 21 U.S.C. section 360bbb-3(b)(1), unless the authorization is terminated or revoked sooner. Performed at [facility], [address] [place], [place] [address]         Code  Status Orders  (From admission, onward)         Start     Ordered   03/25/19 1436  Full code  Continuous     03/25/19 1435        Code Status History    Date Active Date Inactive Code Status Order ID Comments User Context   03/24/2019 1811 03/25/2019 1435 Full Code [id number]  Nicholes Mango, MD Inpatient   Advance Care Planning Activity    Advance Directive Documentation     Most Recent Value  Type of Advance Directive  Healthcare Power of Attorney, Living will  Pre-existing out of facility DNR order (yellow form or pink MOST form)  -  "MOST" Form in Place?  -          Follow-up Information    Adin [name], MD. Go on 03/31/2019.   Specialty: Internal  Medicine Why: appointment 8:30 Contact information: [place] [address] [phone]        Corey Skains, MD On 04/09/2019.   Specialty: Cardiology Why: appointment at [facility] information: [address] [place] [place] [address] [phone]           Discharge Medications   Allergies as of 03/26/2019      Reactions   Formaldehyde Rash      Medication List    TAKE these medications   aspirin 81 MG chewable tablet Chew 81 mg by mouth daily.   Calcium 600+D 600-200 MG-UNIT Tabs Generic drug: Calcium Carbonate-Vitamin D Take 1 tablet by mouth daily.   carvedilol 6.25 MG tablet Commonly known as: Coreg Take 1 tablet (6.25 mg total) by mouth 2 (two) times daily.   clopidogrel 75 MG tablet Commonly known as: PLAVIX Take 1 tablet (75 mg total) by mouth daily with breakfast.   denosumab 60 MG/ML Soln injection Commonly known as: PROLIA Inject 60 mg into the skin every 6 (six) months. Administer in upper arm, thigh, or abdomen   ICaps Areds 2 Caps Take 2 capsules by mouth 2 (two) times daily at 8 am and 10 pm.   levothyroxine 75 MCG tablet Commonly known as: SYNTHROID Take 75 mcg by mouth daily. Take on an empty stomach with a glass of water at least 30-60 minutes before breakfast   Multi-Vitamins Tabs Take 1 tablet by mouth daily.   omeprazole 40 MG capsule Commonly known as: PRILOSEC Take 40 mg by mouth 2 (two) times daily.   simvastatin 80 MG tablet Commonly known as: Zocor Take 1 tablet (80 mg total) by mouth every evening. What changed:   medication strength  how much to take  when to take this   sucralfate 1 GM/10ML suspension Commonly known as: CARAFATE Take 10 mLs by mouth 4 (four) times daily.   V-R MAGNESIUM 250 MG Tabs Generic drug: Magnesium Take 250 mg by mouth daily.   Vitamin D3 50 MCG (2000 UT) capsule Take 2,000 Units by mouth daily.           Total Time in preparing paper work, data evaluation and todays exam - 35 minutes  Dustin Flock M.D on 03/26/2019 at [place]  [phone]

## 2019-03-29 ENCOUNTER — Encounter: Payer: Self-pay | Admitting: Internal Medicine

## 2019-03-30 DIAGNOSIS — I2510 Atherosclerotic heart disease of native coronary artery without angina pectoris: Secondary | ICD-10-CM | POA: Insufficient documentation

## 2019-04-15 ENCOUNTER — Other Ambulatory Visit: Payer: Self-pay

## 2019-04-15 ENCOUNTER — Encounter: Payer: Medicare Other | Attending: Cardiology | Admitting: *Deleted

## 2019-04-15 DIAGNOSIS — I10 Essential (primary) hypertension: Secondary | ICD-10-CM | POA: Insufficient documentation

## 2019-04-15 DIAGNOSIS — L409 Psoriasis, unspecified: Secondary | ICD-10-CM | POA: Insufficient documentation

## 2019-04-15 DIAGNOSIS — H353290 Exudative age-related macular degeneration, unspecified eye, stage unspecified: Secondary | ICD-10-CM | POA: Insufficient documentation

## 2019-04-15 DIAGNOSIS — Z955 Presence of coronary angioplasty implant and graft: Secondary | ICD-10-CM

## 2019-04-15 DIAGNOSIS — E7800 Pure hypercholesterolemia, unspecified: Secondary | ICD-10-CM | POA: Insufficient documentation

## 2019-04-15 DIAGNOSIS — I214 Non-ST elevation (NSTEMI) myocardial infarction: Secondary | ICD-10-CM

## 2019-04-15 NOTE — Progress Notes (Signed)
Virtual Initial orientation completed. Diagnosis can be found in [facility] 8/12. EP/RD orientation scheduled for 9/8 at 9:30

## 2019-04-20 ENCOUNTER — Other Ambulatory Visit: Payer: Self-pay

## 2019-04-20 DIAGNOSIS — I214 Non-ST elevation (NSTEMI) myocardial infarction: Secondary | ICD-10-CM

## 2019-04-20 DIAGNOSIS — Z955 Presence of coronary angioplasty implant and graft: Secondary | ICD-10-CM | POA: Diagnosis not present

## 2019-04-20 NOTE — Patient Instructions (Signed)
Patient Instructions  Patient Details  Name: Katelyn Manning MRN: [id number] Date of Birth: [birth year] Referring Provider:  Isaias Cowman, MD  Below are your personal goals for exercise, nutrition, and risk factors. Our goal is to help you stay on track towards obtaining and maintaining these goals. We will be discussing your progress on these goals with you throughout the program.  Initial Exercise Prescription: Initial Exercise Prescription - 04/20/19 1100      Date of Initial Exercise RX and Referring Provider   Date  04/20/19    Referring Provider  Paraschos      Treadmill   MPH  2.5    Grade  1    Minutes  15    METs  2      Recumbant Bike   Level  2    RPM  50    Watts  20    Minutes  15    METs  2      Arm Ergometer   Level  1    Watts  10    RPM  15    Minutes  15    METs  1.5      REL-XR   Level  2    Watts  20    Speed  50    Minutes  15    METs  2      T5 Nustep   Level  1    SPM  50    Minutes  15    METs  2      Biostep-RELP   Level  1    SPM  60    Minutes  15    METs  2      Prescription Details   Frequency (times per week)  3    Duration  Progress to 30 minutes of continuous aerobic without signs/symptoms of physical distress      Intensity   THRR 40-80% of Max Heartrate  101-133    Ratings of Perceived Exertion  11-15    Perceived Dyspnea  0-4      Progression   Progression  Continue progressive overload as per policy without signs/symptoms or physical distress.      Resistance Training   Training Prescription  Yes    Weight  3    Reps  10-15       Exercise Goals: Frequency: Be able to perform aerobic exercise two to three times per week in program working toward 2-5 days per week of home exercise.  Intensity: Work with a perceived exertion of 11 (fairly light) - 15 (hard) while following your exercise prescription.  We will make changes to your prescription with you as you progress through the program.   Duration:  Be able to do 30 to 45 minutes of continuous aerobic exercise in addition to a 5 minute warm-up and a 5 minute cool-down routine.   Nutrition Goals: Your personal nutrition goals will be established when you do your nutrition analysis with the dietician.  The following are general nutrition guidelines to follow: Cholesterol < 200mg /day Sodium < 1500mg /day Fiber: Women over 50 yrs - 21 grams per day  Personal Goals: Personal Goals and Risk Factors at Admission - 04/15/19 1037      Core Components/Risk Factors/Patient Goals on Admission   Hypertension  Yes    Intervention  Provide education on lifestyle modifcations including regular physical activity/exercise, weight management, moderate sodium restriction and increased consumption of fresh fruit, vegetables, and low  fat dairy, alcohol moderation, and smoking cessation.;Monitor prescription use compliance.    Expected Outcomes  Short Term: Continued assessment and intervention until BP is < 140/74mm HG in hypertensive participants. < 130/61mm HG in hypertensive participants with diabetes, heart failure or chronic kidney disease.;Long Term: Maintenance of blood pressure at goal levels.    Lipids  Yes    Intervention  Provide education and support for participant on nutrition & aerobic/resistive exercise along with prescribed medications to achieve LDL 70mg , HDL >40mg .    Expected Outcomes  Short Term: Participant states understanding of desired cholesterol values and is compliant with medications prescribed. Participant is following exercise prescription and nutrition guidelines.;Long Term: Cholesterol controlled with medications as prescribed, with individualized exercise RX and with personalized nutrition plan. Value goals: LDL < 70mg , HDL > 40 mg.       Tobacco Use Initial Evaluation: Social History   Tobacco Use  Smoking Status Never Smoker  Smokeless Tobacco Never Used    Exercise Goals and Review: Exercise Goals    Row Name  04/20/19 1119             Exercise Goals   Increase Physical Activity  Yes       Intervention  Provide advice, education, support and counseling about physical activity/exercise needs.;Develop an individualized exercise prescription for aerobic and resistive training based on initial evaluation findings, risk stratification, comorbidities and participant's personal goals.       Expected Outcomes  Short Term: Attend rehab on a regular basis to increase amount of physical activity.;Long Term: Add in home exercise to make exercise part of routine and to increase amount of physical activity.;Long Term: Exercising regularly at least 3-5 days a week.       Increase Strength and Stamina  Yes       Intervention  Provide advice, education, support and counseling about physical activity/exercise needs.;Develop an individualized exercise prescription for aerobic and resistive training based on initial evaluation findings, risk stratification, comorbidities and participant's personal goals.       Expected Outcomes  Short Term: Increase workloads from initial exercise prescription for resistance, speed, and METs.;Short Term: Perform resistance training exercises routinely during rehab and add in resistance training at home;Long Term: Improve cardiorespiratory fitness, muscular endurance and strength as measured by increased METs and functional capacity (6MWT)       Able to understand and use rate of perceived exertion (RPE) scale  Yes       Intervention  Provide education and explanation on how to use RPE scale       Expected Outcomes  Short Term: Able to use RPE daily in rehab to express subjective intensity level;Long Term:  Able to use RPE to guide intensity level when exercising independently       Able to understand and use Dyspnea scale  Yes       Intervention  Provide education and explanation on how to use Dyspnea scale       Expected Outcomes  Short Term: Able to use Dyspnea scale daily in rehab to  express subjective sense of shortness of breath during exertion;Long Term: Able to use Dyspnea scale to guide intensity level when exercising independently       Knowledge and understanding of Target Heart Rate Range (THRR)  Yes       Intervention  Provide education and explanation of THRR including how the numbers were predicted and where they are located for reference       Expected Outcomes  Short  Term: Able to state/look up THRR;Long Term: Able to use THRR to govern intensity when exercising independently;Short Term: Able to use daily as guideline for intensity in rehab       Able to check pulse independently  Yes       Intervention  Provide education and demonstration on how to check pulse in carotid and radial arteries.;Review the importance of being able to check your own pulse for safety during independent exercise       Expected Outcomes  Short Term: Able to explain why pulse checking is important during independent exercise;Long Term: Able to check pulse independently and accurately       Understanding of Exercise Prescription  Yes       Intervention  Provide education, explanation, and written materials on patient's individual exercise prescription       Expected Outcomes  Short Term: Able to explain program exercise prescription;Long Term: Able to explain home exercise prescription to exercise independently          Copy of goals given to participant.

## 2019-04-20 NOTE — Progress Notes (Signed)
Cardiac Individual Treatment Plan  Patient Details  Name: Katelyn Manning MRN: [id number] Date of Birth: [birth year] Referring Provider:     Cardiac Rehab from 04/20/2019 in [facility] Cardiac and Pulmonary Rehab  Referring Provider  Paraschos      Initial Encounter Date:    Cardiac Rehab from 04/20/2019 in [facility] Cardiac and Pulmonary Rehab  Date  04/20/19      Visit Diagnosis: NSTEMI (non-ST elevated myocardial infarction) [facility])  Status post coronary artery stent placement  Patient's Home Medications on Admission:  Current Outpatient Medications:  .  aspirin 81 MG chewable tablet, Chew 81 mg by mouth daily. , Disp: , Rfl:  .  Calcium Carbonate-Vitamin D (CALCIUM 600+D) 600-200 MG-UNIT TABS, Take 1 tablet by mouth daily. , Disp: , Rfl:  .  carvedilol (COREG) 6.25 MG tablet, Take 1 tablet (6.25 mg total) by mouth 2 (two) times daily., Disp: 60 tablet, Rfl: 11 .  Cholecalciferol (VITAMIN D3) 2000 UNITS capsule, Take 2,000 Units by mouth daily. , Disp: , Rfl:  .  clopidogrel (PLAVIX) 75 MG tablet, Take 1 tablet (75 mg total) by mouth daily with breakfast., Disp: 30 tablet, Rfl: 3 .  denosumab (PROLIA) 60 MG/ML SOLN injection, Inject 60 mg into the skin every 6 (six) months. Administer in upper arm, thigh, or abdomen, Disp: , Rfl:  .  levothyroxine (SYNTHROID) 75 MCG tablet, Take 75 mcg by mouth daily. Take on an empty stomach with a glass of water at least 30-60 minutes before breakfast, Disp: , Rfl:  .  losartan (COZAAR) 25 MG tablet, Take by mouth., Disp: , Rfl:  .  Magnesium (V-R MAGNESIUM) 250 MG TABS, Take 250 mg by mouth daily., Disp: , Rfl:  .  Multiple Vitamin (MULTI-VITAMINS) TABS, Take 1 tablet by mouth daily. , Disp: , Rfl:  .  Multiple Vitamins-Minerals (ICAPS AREDS 2) CAPS, Take 2 capsules by mouth 2 (two) times daily at 8 am and 10 pm., Disp: , Rfl:  .  omeprazole (PRILOSEC) 40 MG capsule, Take 40 mg by mouth 2 (two) times daily., Disp: , Rfl:  .  simvastatin (ZOCOR) 80 MG tablet,  Take 1 tablet (80 mg total) by mouth every evening., Disp: 30 tablet, Rfl: 11 .  sucralfate (CARAFATE) 1 GM/10ML suspension, Take 10 mLs by mouth 4 (four) times daily., Disp: , Rfl:   Past Medical History: Past Medical History:  Diagnosis Date  . 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 Carcinoma of overlapping sites of left breast in female, estrogen receptor positive ([place])   . DVT (deep venous thrombosis) ([place])   . DVT of leg (deep venous thrombosis) ([place]) 1970   while on birth control  . History of left breast cancer 03/25/2010   stage 1, clinical infiltrating carcinoma, tumor size 0.9 cm; margins clear, lymph nodes negative, ER/PR positive/HER2 negative  . History of radiation therapy   . History of spinal fracture   . Hyperlipidemia   . Hypertension   . Hypothyroidism   . Macular degeneration, wet ([place])   . Osteoporosis   . Personal history of radiation therapy 2011   left breast  . Psoriasis     Tobacco Use: Social History   Tobacco Use  Smoking Status Never Smoker  Smokeless Tobacco Never Used    Labs: Recent Review Flowsheet Data    Labs for ITP Cardiac and Pulmonary Rehab Latest Ref Rng & Units 03/25/2019   Cholestrol 0 - 200 mg/dL 120  LDLCALC 0 - 99 mg/dL 62   HDL >40 mg/dL 38(L)   Trlycerides <150 mg/dL 102       Exercise Target Goals: Exercise Program Goal: Individual exercise prescription set using results from initial 6 min walk test and THRR while considering  patient's activity barriers and safety.   Exercise Prescription Goal: Initial exercise prescription builds to 30-45 minutes a day of aerobic activity, 2-3 days per week.  Home exercise guidelines will be given to patient during program as part of exercise prescription that the participant will acknowledge.  Activity Barriers & Risk Stratification: Activity Barriers & Cardiac Risk Stratification - 04/15/19 1034      Activity Barriers & Cardiac Risk  Stratification   Activity Barriers  Arthritis    Cardiac Risk Stratification  Moderate       6 Minute Walk: 6 Minute Walk    Row Name 04/20/19 1113         6 Minute Walk   Phase  Initial     Distance  1500 feet     Walk Time  6 minutes     # of Rest Breaks  0     MPH  2.84     METS  3.21     RPE  11     Perceived Dyspnea   1     VO2 Peak  11.26     Symptoms  No     Resting HR  69 bpm     Resting BP  160/80     Max Ex. HR  115 bpm     Max Ex. BP  164/80     2 Minute Post BP  140/80        Oxygen Initial Assessment:   Oxygen Re-Evaluation:   Oxygen Discharge (Final Oxygen Re-Evaluation):   Initial Exercise Prescription: Initial Exercise Prescription - 04/20/19 1100      Date of Initial Exercise RX and Referring Provider   Date  04/20/19    Referring Provider  Paraschos      Treadmill   MPH  2.5    Grade  1    Minutes  15    METs  2      Recumbant Bike   Level  2    RPM  50    Watts  20    Minutes  15    METs  2      Arm Ergometer   Level  1    Watts  10    RPM  15    Minutes  15    METs  1.5      REL-XR   Level  2    Watts  20    Speed  50    Minutes  15    METs  2      T5 Nustep   Level  1    SPM  50    Minutes  15    METs  2      Biostep-RELP   Level  1    SPM  60    Minutes  15    METs  2      Prescription Details   Frequency (times per week)  3    Duration  Progress to 30 minutes of continuous aerobic without signs/symptoms of physical distress      Intensity   THRR 40-80% of Max Heartrate  101-133    Ratings of Perceived Exertion  11-15    Perceived  Dyspnea  0-4      Progression   Progression  Continue progressive overload as per policy without signs/symptoms or physical distress.      Resistance Training   Training Prescription  Yes    Weight  3    Reps  10-15       Perform Capillary Blood Glucose checks as needed.  Exercise Prescription Changes:   Exercise Comments:   Exercise Goals and  Review: Exercise Goals    Row Name 04/20/19 1119             Exercise Goals   Increase Physical Activity  Yes       Intervention  Provide advice, education, support and counseling about physical activity/exercise needs.;Develop an individualized exercise prescription for aerobic and resistive training based on initial evaluation findings, risk stratification, comorbidities and participant's personal goals.       Expected Outcomes  Short Term: Attend rehab on a regular basis to increase amount of physical activity.;Long Term: Add in home exercise to make exercise part of routine and to increase amount of physical activity.;Long Term: Exercising regularly at least 3-5 days a week.       Increase Strength and Stamina  Yes       Intervention  Provide advice, education, support and counseling about physical activity/exercise needs.;Develop an individualized exercise prescription for aerobic and resistive training based on initial evaluation findings, risk stratification, comorbidities and participant's personal goals.       Expected Outcomes  Short Term: Increase workloads from initial exercise prescription for resistance, speed, and METs.;Short Term: Perform resistance training exercises routinely during rehab and add in resistance training at home;Long Term: Improve cardiorespiratory fitness, muscular endurance and strength as measured by increased METs and functional capacity (6MWT)       Able to understand and use rate of perceived exertion (RPE) scale  Yes       Intervention  Provide education and explanation on how to use RPE scale       Expected Outcomes  Short Term: Able to use RPE daily in rehab to express subjective intensity level;Long Term:  Able to use RPE to guide intensity level when exercising independently       Able to understand and use Dyspnea scale  Yes       Intervention  Provide education and explanation on how to use Dyspnea scale       Expected Outcomes  Short Term: Able to  use Dyspnea scale daily in rehab to express subjective sense of shortness of breath during exertion;Long Term: Able to use Dyspnea scale to guide intensity level when exercising independently       Knowledge and understanding of Target Heart Rate Range (THRR)  Yes       Intervention  Provide education and explanation of THRR including how the numbers were predicted and where they are located for reference       Expected Outcomes  Short Term: Able to state/look up THRR;Long Term: Able to use THRR to govern intensity when exercising independently;Short Term: Able to use daily as guideline for intensity in rehab       Able to check pulse independently  Yes       Intervention  Provide education and demonstration on how to check pulse in carotid and radial arteries.;Review the importance of being able to check your own pulse for safety during independent exercise       Expected Outcomes  Short Term: Able to explain why pulse checking is  important during independent exercise;Long Term: Able to check pulse independently and accurately       Understanding of Exercise Prescription  Yes       Intervention  Provide education, explanation, and written materials on patient's individual exercise prescription       Expected Outcomes  Short Term: Able to explain program exercise prescription;Long Term: Able to explain home exercise prescription to exercise independently          Exercise Goals Re-Evaluation :   Discharge Exercise Prescription (Final Exercise Prescription Changes):   Nutrition:  Target Goals: Understanding of nutrition guidelines, daily intake of sodium '1500mg'$ , cholesterol '200mg'$ , calories 30% from fat and 7% or less from saturated fats, daily to have 5 or more servings of fruits and vegetables.  Biometrics:    Nutrition Therapy Plan and Nutrition Goals: Nutrition Therapy & Goals - 04/20/19 1104      Nutrition Therapy   Diet  Low Na, HH diet    Protein (specify units)  45-50g     Fiber  25 grams    Whole Grain Foods  3 servings    Saturated Fats  12 max. grams    Fruits and Vegetables  5 servings/day    Sodium  1.5 grams      Personal Nutrition Goals   Nutrition Goal  ST/LT: continue [place] eating and stay healthy    Comments  Pt reports being on slimfast diet which her doctor told her is healthy as her doctor and her agreed she needed to lose weight. Discussed how BMI is not the end all be all. Mentioned that evidence shows it is most beneficial to get our vitmains and minerals from foods like whole grains, fruits and vegetables, legumes, nuts/seeds and not from supplements; supplements are meant to supplement healthy diet, not replace. food provide fiber, are more emotionally satiating, and have synergistic effects as well as giving a variety of phytonutrients. Pt reports having 2 slimfast shakes a day, some snacks like nuts and fruit during the day, and cereal in the morning, discussed how that may not be enough calories in a day for her needs and to support her workouts. Discussed how she may gain weight while here due to the exercise and strength gain. Pt reports on weekend will "cheat" and have vegetable omelette, muffin, and oatmeal: something small for lunch like a taco at a fast food restaurant: and meat with vegetbales for dinner. Pt reports will sometimes have a candy bar. Pt reports not eating much. Discussed HH eating - RD flagged some disordered eating talk; monitor.      Intervention Plan   Intervention  Prescribe, educate and counsel regarding individualized specific dietary modifications aiming towards targeted core components such as weight, hypertension, lipid management, diabetes, heart failure and other comorbidities.;Nutrition handout(s) given to patient.    Expected Outcomes  Short Term Goal: Understand basic principles of dietary content, such as calories, fat, sodium, cholesterol and nutrients.;Short Term Goal: A plan has been developed with personal  nutrition goals set during dietitian appointment.;Long Term Goal: Adherence to prescribed nutrition plan.       Nutrition Assessments:   Nutrition Goals Re-Evaluation:   Nutrition Goals Discharge (Final Nutrition Goals Re-Evaluation):   Psychosocial: Target Goals: Acknowledge presence or absence of significant depression and/or stress, maximize coping skills, provide positive support system. Participant is able to verbalize types and ability to use techniques and skills needed for reducing stress and depression.   Initial Review & Psychosocial Screening: Initial Psych Review &  Screening - 04/15/19 1035      Initial Review   Current issues with  None Identified      Family Dynamics   Good Support System?  Yes   husband, daughter     Barriers   Psychosocial barriers to participate in program  There are no identifiable barriers or psychosocial needs.;The patient should benefit from training in stress management and relaxation.      Screening Interventions   Interventions  Encouraged to exercise;To provide support and resources with identified psychosocial needs;Provide feedback about the scores to participant    Expected Outcomes  Short Term goal: Utilizing psychosocial counselor, staff and physician to assist with identification of specific Stressors or current issues interfering with healing process. Setting desired goal for each stressor or current issue identified.;Long Term Goal: Stressors or current issues are controlled or eliminated.;Short Term goal: Identification and review with participant of any Quality of Life or Depression concerns found by scoring the questionnaire.;Long Term goal: The participant improves quality of Life and PHQ9 Scores as seen by post scores and/or verbalization of changes       Quality of Life Scores:  Quality of Life - 04/20/19 1121      Quality of Life   Select  Quality of Life      Scores of 19 and below usually indicate a poorer quality of  life in these areas.  A difference of  2-3 points is a clinically meaningful difference.  A difference of 2-3 points in the total score of the Quality of Life Index has been associated with significant improvement in overall quality of life, self-image, physical symptoms, and general health in studies assessing change in quality of life.  PHQ-9: Recent Review Flowsheet Data    There is no flowsheet data to display.     Interpretation of Total Score  Total Score Depression Severity:  1-4 = Minimal depression, 5-9 = Mild depression, 10-14 = Moderate depression, 15-19 = Moderately severe depression, 20-27 = Severe depression   Psychosocial Evaluation and Intervention:   Psychosocial Re-Evaluation:   Psychosocial Discharge (Final Psychosocial Re-Evaluation):   Vocational Rehabilitation: Provide vocational rehab assistance to qualifying candidates.   Vocational Rehab Evaluation & Intervention: Vocational Rehab - 04/15/19 1035      Initial Vocational Rehab Evaluation & Intervention   Assessment shows need for Vocational Rehabilitation  No       Education: Education Goals: Education classes will be provided on a variety of topics geared toward better understanding of heart health and risk factor modification. Participant will state understanding/return demonstration of topics presented as noted by education test scores.  Learning Barriers/Preferences: Learning Barriers/Preferences - 04/15/19 1035      Learning Barriers/Preferences   Learning Barriers  None    Learning Preferences  Individual Instruction;Group Instruction       Education Topics:  AED/CPR: - Group verbal and written instruction with the use of models to demonstrate the basic use of the AED with the basic ABC's of resuscitation.   General Nutrition Guidelines/Fats and Fiber: -Group instruction provided by verbal, written material, models and posters to present the general guidelines for heart healthy  nutrition. Gives an explanation and review of dietary fats and fiber.   Controlling Sodium/Reading Food Labels: -Group verbal and written material supporting the discussion of sodium use in heart healthy nutrition. Review and explanation with models, verbal and written materials for utilization of the food label.   Exercise Physiology & General Exercise Guidelines: - Group verbal and written instruction  with models to review the exercise physiology of the cardiovascular system and associated critical values. Provides general exercise guidelines with specific guidelines to those with heart or lung disease.    Aerobic Exercise & Resistance Training: - Gives group verbal and written instruction on the various components of exercise. Focuses on aerobic and resistive training programs and the benefits of this training and how to safely progress through these programs..   Flexibility, Balance, Mind/Body Relaxation: Provides group verbal/written instruction on the benefits of flexibility and balance training, including mind/body exercise modes such as yoga, pilates and tai chi.  Demonstration and skill practice provided.   Stress and Anxiety: - Provides group verbal and written instruction about the health risks of elevated stress and causes of high stress.  Discuss the correlation between heart/lung disease and anxiety and treatment options. Review healthy ways to manage with stress and anxiety.   Depression: - Provides group verbal and written instruction on the correlation between heart/lung disease and depressed mood, treatment options, and the stigmas associated with seeking treatment.   Anatomy & Physiology of the Heart: - Group verbal and written instruction and models provide basic cardiac anatomy and physiology, with the coronary electrical and arterial systems. Review of Valvular disease and Heart Failure   Cardiac Procedures: - Group verbal and written instruction to review  commonly prescribed medications for heart disease. Reviews the medication, class of the drug, and side effects. Includes the steps to properly store meds and maintain the prescription regimen. (beta blockers and nitrates)   Cardiac Medications I: - Group verbal and written instruction to review commonly prescribed medications for heart disease. Reviews the medication, class of the drug, and side effects. Includes the steps to properly store meds and maintain the prescription regimen.   Cardiac Medications II: -Group verbal and written instruction to review commonly prescribed medications for heart disease. Reviews the medication, class of the drug, and side effects. (all other drug classes)    Go Sex-Intimacy & Heart Disease, Get SMART - Goal Setting: - Group verbal and written instruction through game format to discuss heart disease and the return to sexual intimacy. Provides group verbal and written material to discuss and apply goal setting through the application of the S.M.A.R.T. Method.   Other Matters of the Heart: - Provides group verbal, written materials and models to describe Stable Angina and Peripheral Artery. Includes description of the disease process and treatment options available to the cardiac patient.   Exercise & Equipment Safety: - Individual verbal instruction and demonstration of equipment use and safety with use of the equipment.   Cardiac Rehab from 04/20/2019 in [facility] Cardiac and Pulmonary Rehab  Date  04/20/19  Educator  [place]  Instruction Review Code  1- Verbalizes Understanding      Infection Prevention: - Provides verbal and written material to individual with discussion of infection control including proper hand washing and proper equipment cleaning during exercise session.   Cardiac Rehab from 04/20/2019 in [facility] Cardiac and Pulmonary Rehab  Date  04/20/19  Educator  [place]  Instruction Review Code  1- Verbalizes Understanding      Falls Prevention: -  Provides verbal and written material to individual with discussion of falls prevention and safety.   Cardiac Rehab from 04/20/2019 in [facility] Cardiac and Pulmonary Rehab  Date  04/20/19  Educator  [place]  Instruction Review Code  1- Verbalizes Understanding      Diabetes: - Individual verbal and written instruction to review signs/symptoms of diabetes, desired ranges of  glucose level fasting, after meals and with exercise. Acknowledge that pre and post exercise glucose checks will be done for 3 sessions at entry of program.   Know Your Numbers and Risk Factors: -Group verbal and written instruction about important numbers in your health.  Discussion of what are risk factors and how they play a role in the disease process.  Review of Cholesterol, Blood Pressure, Diabetes, and BMI and the role they play in your overall health.   Sleep Hygiene: -Provides group verbal and written instruction about how sleep can affect your health.  Define sleep hygiene, discuss sleep cycles and impact of sleep habits. Review good sleep hygiene tips.    Other: -Provides group and verbal instruction on various topics (see comments)   Knowledge Questionnaire Score:   Core Components/Risk Factors/Patient Goals at Admission: Personal Goals and Risk Factors at Admission - 04/15/19 1037      Core Components/Risk Factors/Patient Goals on Admission   Hypertension  Yes    Intervention  Provide education on lifestyle modifcations including regular physical activity/exercise, weight management, moderate sodium restriction and increased consumption of fresh fruit, vegetables, and low fat dairy, alcohol moderation, and smoking cessation.;Monitor prescription use compliance.    Expected Outcomes  Short Term: Continued assessment and intervention until BP is < 140/75m HG in hypertensive participants. < 130/835mHG in hypertensive participants with diabetes, heart failure or chronic kidney disease.;Long Term: Maintenance of  blood pressure at goal levels.    Lipids  Yes    Intervention  Provide education and support for participant on nutrition & aerobic/resistive exercise along with prescribed medications to achieve LDL '70mg'$ , HDL >'40mg'$ .    Expected Outcomes  Short Term: Participant states understanding of desired cholesterol values and is compliant with medications prescribed. Participant is following exercise prescription and nutrition guidelines.;Long Term: Cholesterol controlled with medications as prescribed, with individualized exercise RX and with personalized nutrition plan. Value goals: LDL < '70mg'$ , HDL > 40 mg.       Core Components/Risk Factors/Patient Goals Review:  Goals and Risk Factor Review    Row Name 04/20/19 1120             Core Components/Risk Factors/Patient Goals Review   Personal Goals Review  Hypertension;Lipids          Core Components/Risk Factors/Patient Goals at Discharge (Final Review):  Goals and Risk Factor Review - 04/20/19 1120      Core Components/Risk Factors/Patient Goals Review   Personal Goals Review  Hypertension;Lipids       ITP Comments: ITP Comments    Row Name 04/15/19 1043           ITP Comments  Virtual Initial orientation completed. [facility] EP/RD orientation scheduled for 9/8 at 9:30          Comments: Initial ITP

## 2019-04-21 ENCOUNTER — Encounter: Payer: Self-pay | Admitting: *Deleted

## 2019-04-21 ENCOUNTER — Encounter: Payer: Medicare Other | Admitting: *Deleted

## 2019-04-21 DIAGNOSIS — I214 Non-ST elevation (NSTEMI) myocardial infarction: Secondary | ICD-10-CM

## 2019-04-21 DIAGNOSIS — Z955 Presence of coronary angioplasty implant and graft: Secondary | ICD-10-CM

## 2019-04-21 NOTE — Progress Notes (Signed)
Cardiac Individual Treatment Plan  Patient Details  Name: Katelyn Manning MRN: [id number] Date of Birth: [birth year] Referring Provider:     Cardiac Rehab from 04/20/2019 in [facility] Cardiac and Pulmonary Rehab  Referring Provider  Paraschos      Initial Encounter Date:    Cardiac Rehab from 04/20/2019 in [facility] Cardiac and Pulmonary Rehab  Date  04/20/19      Visit Diagnosis: NSTEMI (non-ST elevated myocardial infarction) [facility])  Status post coronary artery stent placement  Patient's Home Medications on Admission:  Current Outpatient Medications:  .  aspirin 81 MG chewable tablet, Chew 81 mg by mouth daily. , Disp: , Rfl:  .  Calcium Carbonate-Vitamin D (CALCIUM 600+D) 600-200 MG-UNIT TABS, Take 1 tablet by mouth daily. , Disp: , Rfl:  .  carvedilol (COREG) 6.25 MG tablet, Take 1 tablet (6.25 mg total) by mouth 2 (two) times daily., Disp: 60 tablet, Rfl: 11 .  Cholecalciferol (VITAMIN D3) 2000 UNITS capsule, Take 2,000 Units by mouth daily. , Disp: , Rfl:  .  clopidogrel (PLAVIX) 75 MG tablet, Take 1 tablet (75 mg total) by mouth daily with breakfast., Disp: 30 tablet, Rfl: 3 .  denosumab (PROLIA) 60 MG/ML SOLN injection, Inject 60 mg into the skin every 6 (six) months. Administer in upper arm, thigh, or abdomen, Disp: , Rfl:  .  levothyroxine (SYNTHROID) 75 MCG tablet, Take 75 mcg by mouth daily. Take on an empty stomach with a glass of water at least 30-60 minutes before breakfast, Disp: , Rfl:  .  losartan (COZAAR) 25 MG tablet, Take by mouth., Disp: , Rfl:  .  Magnesium (V-R MAGNESIUM) 250 MG TABS, Take 250 mg by mouth daily., Disp: , Rfl:  .  Multiple Vitamin (MULTI-VITAMINS) TABS, Take 1 tablet by mouth daily. , Disp: , Rfl:  .  Multiple Vitamins-Minerals (ICAPS AREDS 2) CAPS, Take 2 capsules by mouth 2 (two) times daily at 8 am and 10 pm., Disp: , Rfl:  .  omeprazole (PRILOSEC) 40 MG capsule, Take 40 mg by mouth 2 (two) times daily., Disp: , Rfl:  .  simvastatin (ZOCOR) 80 MG tablet,  Take 1 tablet (80 mg total) by mouth every evening., Disp: 30 tablet, Rfl: 11 .  sucralfate (CARAFATE) 1 GM/10ML suspension, Take 10 mLs by mouth 4 (four) times daily., Disp: , Rfl:   Past Medical History: Past Medical History:  Diagnosis Date  . 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 Carcinoma of overlapping sites of left breast in female, estrogen receptor positive ([place])   . DVT (deep venous thrombosis) ([place])   . DVT of leg (deep venous thrombosis) ([place]) 1970   while on birth control  . History of left breast cancer 03/25/2010   stage 1, clinical infiltrating carcinoma, tumor size 0.9 cm; margins clear, lymph nodes negative, ER/PR positive/HER2 negative  . History of radiation therapy   . History of spinal fracture   . Hyperlipidemia   . Hypertension   . Hypothyroidism   . Macular degeneration, wet ([place])   . Osteoporosis   . Personal history of radiation therapy 2011   left breast  . Psoriasis     Tobacco Use: Social History   Tobacco Use  Smoking Status Never Smoker  Smokeless Tobacco Never Used    Labs: Recent Review Flowsheet Data    Labs for ITP Cardiac and Pulmonary Rehab Latest Ref Rng & Units 03/25/2019   Cholestrol 0 - 200 mg/dL 120  LDLCALC 0 - 99 mg/dL 62   HDL >40 mg/dL 38(L)   Trlycerides <150 mg/dL 102       Exercise Target Goals: Exercise Program Goal: Individual exercise prescription set using results from initial 6 min walk test and THRR while considering  patient's activity barriers and safety.   Exercise Prescription Goal: Initial exercise prescription builds to 30-45 minutes a day of aerobic activity, 2-3 days per week.  Home exercise guidelines will be given to patient during program as part of exercise prescription that the participant will acknowledge.  Activity Barriers & Risk Stratification: Activity Barriers & Cardiac Risk Stratification - 04/15/19 1034      Activity Barriers & Cardiac Risk  Stratification   Activity Barriers  Arthritis    Cardiac Risk Stratification  Moderate       6 Minute Walk: 6 Minute Walk    Row Name 04/20/19 1113         6 Minute Walk   Phase  Initial     Distance  1500 feet     Walk Time  6 minutes     # of Rest Breaks  0     MPH  2.84     METS  3.21     RPE  11     Perceived Dyspnea   1     VO2 Peak  11.26     Symptoms  No     Resting HR  69 bpm     Resting BP  160/80     Max Ex. HR  115 bpm     Max Ex. BP  164/80     2 Minute Post BP  140/80        Oxygen Initial Assessment:   Oxygen Re-Evaluation:   Oxygen Discharge (Final Oxygen Re-Evaluation):   Initial Exercise Prescription: Initial Exercise Prescription - 04/20/19 1100      Date of Initial Exercise RX and Referring Provider   Date  04/20/19    Referring Provider  Paraschos      Treadmill   MPH  2.5    Grade  1    Minutes  15    METs  2      Recumbant Bike   Level  2    RPM  50    Watts  20    Minutes  15    METs  2      Arm Ergometer   Level  1    Watts  10    RPM  15    Minutes  15    METs  1.5      REL-XR   Level  2    Watts  20    Speed  50    Minutes  15    METs  2      T5 Nustep   Level  1    SPM  50    Minutes  15    METs  2      Biostep-RELP   Level  1    SPM  60    Minutes  15    METs  2      Prescription Details   Frequency (times per week)  3    Duration  Progress to 30 minutes of continuous aerobic without signs/symptoms of physical distress      Intensity   THRR 40-80% of Max Heartrate  101-133    Ratings of Perceived Exertion  11-15    Perceived  Dyspnea  0-4      Progression   Progression  Continue progressive overload as per policy without signs/symptoms or physical distress.      Resistance Training   Training Prescription  Yes    Weight  3    Reps  10-15       Perform Capillary Blood Glucose checks as needed.  Exercise Prescription Changes:   Exercise Comments:   Exercise Goals and  Review: Exercise Goals    Row Name 04/20/19 1119             Exercise Goals   Increase Physical Activity  Yes       Intervention  Provide advice, education, support and counseling about physical activity/exercise needs.;Develop an individualized exercise prescription for aerobic and resistive training based on initial evaluation findings, risk stratification, comorbidities and participant's personal goals.       Expected Outcomes  Short Term: Attend rehab on a regular basis to increase amount of physical activity.;Long Term: Add in home exercise to make exercise part of routine and to increase amount of physical activity.;Long Term: Exercising regularly at least 3-5 days a week.       Increase Strength and Stamina  Yes       Intervention  Provide advice, education, support and counseling about physical activity/exercise needs.;Develop an individualized exercise prescription for aerobic and resistive training based on initial evaluation findings, risk stratification, comorbidities and participant's personal goals.       Expected Outcomes  Short Term: Increase workloads from initial exercise prescription for resistance, speed, and METs.;Short Term: Perform resistance training exercises routinely during rehab and add in resistance training at home;Long Term: Improve cardiorespiratory fitness, muscular endurance and strength as measured by increased METs and functional capacity (6MWT)       Able to understand and use rate of perceived exertion (RPE) scale  Yes       Intervention  Provide education and explanation on how to use RPE scale       Expected Outcomes  Short Term: Able to use RPE daily in rehab to express subjective intensity level;Long Term:  Able to use RPE to guide intensity level when exercising independently       Able to understand and use Dyspnea scale  Yes       Intervention  Provide education and explanation on how to use Dyspnea scale       Expected Outcomes  Short Term: Able to  use Dyspnea scale daily in rehab to express subjective sense of shortness of breath during exertion;Long Term: Able to use Dyspnea scale to guide intensity level when exercising independently       Knowledge and understanding of Target Heart Rate Range (THRR)  Yes       Intervention  Provide education and explanation of THRR including how the numbers were predicted and where they are located for reference       Expected Outcomes  Short Term: Able to state/look up THRR;Long Term: Able to use THRR to govern intensity when exercising independently;Short Term: Able to use daily as guideline for intensity in rehab       Able to check pulse independently  Yes       Intervention  Provide education and demonstration on how to check pulse in carotid and radial arteries.;Review the importance of being able to check your own pulse for safety during independent exercise       Expected Outcomes  Short Term: Able to explain why pulse checking is  important during independent exercise;Long Term: Able to check pulse independently and accurately       Understanding of Exercise Prescription  Yes       Intervention  Provide education, explanation, and written materials on patient's individual exercise prescription       Expected Outcomes  Short Term: Able to explain program exercise prescription;Long Term: Able to explain home exercise prescription to exercise independently          Exercise Goals Re-Evaluation :   Discharge Exercise Prescription (Final Exercise Prescription Changes):   Nutrition:  Target Goals: Understanding of nutrition guidelines, daily intake of sodium '1500mg'$ , cholesterol '200mg'$ , calories 30% from fat and 7% or less from saturated fats, daily to have 5 or more servings of fruits and vegetables.  Biometrics:    Nutrition Therapy Plan and Nutrition Goals: Nutrition Therapy & Goals - 04/20/19 1104      Nutrition Therapy   Diet  Low Na, HH diet    Protein (specify units)  45-50g     Fiber  25 grams    Whole Grain Foods  3 servings    Saturated Fats  12 max. grams    Fruits and Vegetables  5 servings/day    Sodium  1.5 grams      Personal Nutrition Goals   Nutrition Goal  ST/LT: continue [place] eating and stay healthy    Comments  Pt reports being on slimfast diet which her doctor told her is healthy as her doctor and her agreed she needed to lose weight. Discussed how BMI is not the end all be all. Mentioned that evidence shows it is most beneficial to get our vitmains and minerals from foods like whole grains, fruits and vegetables, legumes, nuts/seeds and not from supplements; supplements are meant to supplement healthy diet, not replace. food provide fiber, are more emotionally satiating, and have synergistic effects as well as giving a variety of phytonutrients. Pt reports having 2 slimfast shakes a day, some snacks like nuts and fruit during the day, and cereal in the morning, discussed how that may not be enough calories in a day for her needs and to support her workouts. Discussed how she may gain weight while here due to the exercise and strength gain. Pt reports on weekend will "cheat" and have vegetable omelette, muffin, and oatmeal: something small for lunch like a taco at a fast food restaurant: and meat with vegetbales for dinner. Pt reports will sometimes have a candy bar. Pt reports not eating much. Discussed HH eating - RD flagged some disordered eating talk; monitor.      Intervention Plan   Intervention  Prescribe, educate and counsel regarding individualized specific dietary modifications aiming towards targeted core components such as weight, hypertension, lipid management, diabetes, heart failure and other comorbidities.;Nutrition handout(s) given to patient.    Expected Outcomes  Short Term Goal: Understand basic principles of dietary content, such as calories, fat, sodium, cholesterol and nutrients.;Short Term Goal: A plan has been developed with personal  nutrition goals set during dietitian appointment.;Long Term Goal: Adherence to prescribed nutrition plan.       Nutrition Assessments:   Nutrition Goals Re-Evaluation:   Nutrition Goals Discharge (Final Nutrition Goals Re-Evaluation):   Psychosocial: Target Goals: Acknowledge presence or absence of significant depression and/or stress, maximize coping skills, provide positive support system. Participant is able to verbalize types and ability to use techniques and skills needed for reducing stress and depression.   Initial Review & Psychosocial Screening: Initial Psych Review &  Screening - 04/15/19 1035      Initial Review   Current issues with  None Identified      Family Dynamics   Good Support System?  Yes   husband, daughter     Barriers   Psychosocial barriers to participate in program  There are no identifiable barriers or psychosocial needs.;The patient should benefit from training in stress management and relaxation.      Screening Interventions   Interventions  Encouraged to exercise;To provide support and resources with identified psychosocial needs;Provide feedback about the scores to participant    Expected Outcomes  Short Term goal: Utilizing psychosocial counselor, staff and physician to assist with identification of specific Stressors or current issues interfering with healing process. Setting desired goal for each stressor or current issue identified.;Long Term Goal: Stressors or current issues are controlled or eliminated.;Short Term goal: Identification and review with participant of any Quality of Life or Depression concerns found by scoring the questionnaire.;Long Term goal: The participant improves quality of Life and PHQ9 Scores as seen by post scores and/or verbalization of changes       Quality of Life Scores:  Quality of Life - 04/20/19 1121      Quality of Life   Select  Quality of Life      Scores of 19 and below usually indicate a poorer quality of  life in these areas.  A difference of  2-3 points is a clinically meaningful difference.  A difference of 2-3 points in the total score of the Quality of Life Index has been associated with significant improvement in overall quality of life, self-image, physical symptoms, and general health in studies assessing change in quality of life.  PHQ-9: Recent Review Flowsheet Data    There is no flowsheet data to display.     Interpretation of Total Score  Total Score Depression Severity:  1-4 = Minimal depression, 5-9 = Mild depression, 10-14 = Moderate depression, 15-19 = Moderately severe depression, 20-27 = Severe depression   Psychosocial Evaluation and Intervention:   Psychosocial Re-Evaluation:   Psychosocial Discharge (Final Psychosocial Re-Evaluation):   Vocational Rehabilitation: Provide vocational rehab assistance to qualifying candidates.   Vocational Rehab Evaluation & Intervention: Vocational Rehab - 04/15/19 1035      Initial Vocational Rehab Evaluation & Intervention   Assessment shows need for Vocational Rehabilitation  No       Education: Education Goals: Education classes will be provided on a variety of topics geared toward better understanding of heart health and risk factor modification. Participant will state understanding/return demonstration of topics presented as noted by education test scores.  Learning Barriers/Preferences: Learning Barriers/Preferences - 04/15/19 1035      Learning Barriers/Preferences   Learning Barriers  None    Learning Preferences  Individual Instruction;Group Instruction       Education Topics:  AED/CPR: - Group verbal and written instruction with the use of models to demonstrate the basic use of the AED with the basic ABC's of resuscitation.   General Nutrition Guidelines/Fats and Fiber: -Group instruction provided by verbal, written material, models and posters to present the general guidelines for heart healthy  nutrition. Gives an explanation and review of dietary fats and fiber.   Controlling Sodium/Reading Food Labels: -Group verbal and written material supporting the discussion of sodium use in heart healthy nutrition. Review and explanation with models, verbal and written materials for utilization of the food label.   Exercise Physiology & General Exercise Guidelines: - Group verbal and written instruction  with models to review the exercise physiology of the cardiovascular system and associated critical values. Provides general exercise guidelines with specific guidelines to those with heart or lung disease.    Aerobic Exercise & Resistance Training: - Gives group verbal and written instruction on the various components of exercise. Focuses on aerobic and resistive training programs and the benefits of this training and how to safely progress through these programs..   Flexibility, Balance, Mind/Body Relaxation: Provides group verbal/written instruction on the benefits of flexibility and balance training, including mind/body exercise modes such as yoga, pilates and tai chi.  Demonstration and skill practice provided.   Stress and Anxiety: - Provides group verbal and written instruction about the health risks of elevated stress and causes of high stress.  Discuss the correlation between heart/lung disease and anxiety and treatment options. Review healthy ways to manage with stress and anxiety.   Depression: - Provides group verbal and written instruction on the correlation between heart/lung disease and depressed mood, treatment options, and the stigmas associated with seeking treatment.   Anatomy & Physiology of the Heart: - Group verbal and written instruction and models provide basic cardiac anatomy and physiology, with the coronary electrical and arterial systems. Review of Valvular disease and Heart Failure   Cardiac Procedures: - Group verbal and written instruction to review  commonly prescribed medications for heart disease. Reviews the medication, class of the drug, and side effects. Includes the steps to properly store meds and maintain the prescription regimen. (beta blockers and nitrates)   Cardiac Medications I: - Group verbal and written instruction to review commonly prescribed medications for heart disease. Reviews the medication, class of the drug, and side effects. Includes the steps to properly store meds and maintain the prescription regimen.   Cardiac Medications II: -Group verbal and written instruction to review commonly prescribed medications for heart disease. Reviews the medication, class of the drug, and side effects. (all other drug classes)    Go Sex-Intimacy & Heart Disease, Get SMART - Goal Setting: - Group verbal and written instruction through game format to discuss heart disease and the return to sexual intimacy. Provides group verbal and written material to discuss and apply goal setting through the application of the S.M.A.R.T. Method.   Other Matters of the Heart: - Provides group verbal, written materials and models to describe Stable Angina and Peripheral Artery. Includes description of the disease process and treatment options available to the cardiac patient.   Exercise & Equipment Safety: - Individual verbal instruction and demonstration of equipment use and safety with use of the equipment.   Cardiac Rehab from 04/20/2019 in [facility] Cardiac and Pulmonary Rehab  Date  04/20/19  Educator  [place]  Instruction Review Code  1- Verbalizes Understanding      Infection Prevention: - Provides verbal and written material to individual with discussion of infection control including proper hand washing and proper equipment cleaning during exercise session.   Cardiac Rehab from 04/20/2019 in [facility] Cardiac and Pulmonary Rehab  Date  04/20/19  Educator  [place]  Instruction Review Code  1- Verbalizes Understanding      Falls Prevention: -  Provides verbal and written material to individual with discussion of falls prevention and safety.   Cardiac Rehab from 04/20/2019 in [facility] Cardiac and Pulmonary Rehab  Date  04/20/19  Educator  [place]  Instruction Review Code  1- Verbalizes Understanding      Diabetes: - Individual verbal and written instruction to review signs/symptoms of diabetes, desired ranges of  glucose level fasting, after meals and with exercise. Acknowledge that pre and post exercise glucose checks will be done for 3 sessions at entry of program.   Know Your Numbers and Risk Factors: -Group verbal and written instruction about important numbers in your health.  Discussion of what are risk factors and how they play a role in the disease process.  Review of Cholesterol, Blood Pressure, Diabetes, and BMI and the role they play in your overall health.   Sleep Hygiene: -Provides group verbal and written instruction about how sleep can affect your health.  Define sleep hygiene, discuss sleep cycles and impact of sleep habits. Review good sleep hygiene tips.    Other: -Provides group and verbal instruction on various topics (see comments)   Knowledge Questionnaire Score:   Core Components/Risk Factors/Patient Goals at Admission: Personal Goals and Risk Factors at Admission - 04/15/19 1037      Core Components/Risk Factors/Patient Goals on Admission   Hypertension  Yes    Intervention  Provide education on lifestyle modifcations including regular physical activity/exercise, weight management, moderate sodium restriction and increased consumption of fresh fruit, vegetables, and low fat dairy, alcohol moderation, and smoking cessation.;Monitor prescription use compliance.    Expected Outcomes  Short Term: Continued assessment and intervention until BP is < 140/73m HG in hypertensive participants. < 130/88mHG in hypertensive participants with diabetes, heart failure or chronic kidney disease.;Long Term: Maintenance of  blood pressure at goal levels.    Lipids  Yes    Intervention  Provide education and support for participant on nutrition & aerobic/resistive exercise along with prescribed medications to achieve LDL <7043mHDL >70m36m  Expected Outcomes  Short Term: Participant states understanding of desired cholesterol values and is compliant with medications prescribed. Participant is following exercise prescription and nutrition guidelines.;Long Term: Cholesterol controlled with medications as prescribed, with individualized exercise RX and with personalized nutrition plan. Value goals: LDL < 70mg37mL > 40 mg.       Core Components/Risk Factors/Patient Goals Review:  Goals and Risk Factor Review    Row Name 04/20/19 1120             Core Components/Risk Factors/Patient Goals Review   Personal Goals Review  Hypertension;Lipids          Core Components/Risk Factors/Patient Goals at Discharge (Final Review):  Goals and Risk Factor Review - 04/20/19 1120      Core Components/Risk Factors/Patient Goals Review   Personal Goals Review  Hypertension;Lipids       ITP Comments: ITP Comments    Row Name 04/15/19 1043 04/21/19 0605         ITP Comments  Virtual Initial orientation completed. Diagnosis can be found in [facility]. EP/RD orientation scheduled for 9/8 at 9:30  30 Day review. Continue with ITP unless directed changes per Medical Director review.  New to program         Comments:

## 2019-04-21 NOTE — Progress Notes (Signed)
Daily Session Note  Patient Details  Name: Katelyn Manning MRN: [id number] Date of Birth: 05/21/1951 Referring Provider:     Cardiac Rehab from 04/20/2019 in [facility]  Referring Provider  Paraschos      Encounter Date: 04/21/2019  Check In: Session Check In - 04/21/19 0910      Check-In   Supervising physician immediately available to respond to emergencies  See telemetry face sheet for immediately available ER MD    Location  [facility]    Staff Present  Susanne Bice, RN, BSN, CCRP;Jessica Hawkins, MA, RCEP, CCRP, CCET;Joseph Hood RCP,RRT,BSRT    Virtual Visit  No    Medication changes reported      No    Fall or balance concerns reported     No    Warm-up and Cool-down  Performed on first and last piece of equipment    Resistance Training Performed  Yes    VAD Patient?  No    PAD/SET Patient?  No      Pain Assessment   Currently in Pain?  No/denies          Social History   Tobacco Use  Smoking Status Never Smoker  Smokeless Tobacco Never Used    Goals Met:  Exercise tolerated well Personal goals reviewed No report of cardiac concerns or symptoms Strength training completed today  Goals Unmet:  Not Applicable  Comments: First full day of exercise!  Patient was oriented to gym and equipment including functions, settings, policies, and procedures.  Patient's individual exercise prescription and treatment plan were reviewed.  All starting workloads were established based on the results of the 6 minute walk test done at initial orientation visit.  The plan for exercise progression was also introduced and progression will be customized based on patient's performance and goals.    Dr. Mark Miller is Medical Director for [facility] and [facility]. 

## 2019-04-27 ENCOUNTER — Other Ambulatory Visit: Payer: Self-pay

## 2019-04-27 ENCOUNTER — Encounter: Payer: Medicare Other | Admitting: *Deleted

## 2019-04-27 DIAGNOSIS — Z955 Presence of coronary angioplasty implant and graft: Secondary | ICD-10-CM

## 2019-04-27 DIAGNOSIS — I214 Non-ST elevation (NSTEMI) myocardial infarction: Secondary | ICD-10-CM

## 2019-04-27 NOTE — Progress Notes (Signed)
Daily Session Note  Patient Details  Name: Katelyn Manning MRN: [id number] Date of Birth: 11/10/1950 Referring Provider:     Cardiac Rehab from 04/20/2019 in [facility]  Referring Provider  Paraschos      Encounter Date: 04/27/2019  Check In: Session Check In - 04/27/19 0912      Check-In   Supervising physician immediately available to respond to emergencies  See telemetry face sheet for immediately available ER MD    Location  [facility]    Staff Present  Susanne Bice, RN, BSN, CCRP;Joseph Hood RCP,RRT,BSRT;Amanda Sommer, BA, ACSM CEP, Exercise Physiologist    Virtual Visit  No    Medication changes reported      No    Fall or balance concerns reported     No    Warm-up and Cool-down  Performed on first and last piece of equipment    Resistance Training Performed  Yes    VAD Patient?  No    PAD/SET Patient?  No      Pain Assessment   Currently in Pain?  No/denies          Social History   Tobacco Use  Smoking Status Never Smoker  Smokeless Tobacco Never Used    Goals Met:  Independence with exercise equipment Exercise tolerated well No report of cardiac concerns or symptoms  Goals Unmet:  Not Applicable  Comments: Pt able to follow exercise prescription today without complaint.  Will continue to monitor for progression.    Dr. Mark Miller is Medical Director for [facility] and [facility]. 

## 2019-04-28 ENCOUNTER — Encounter: Payer: Medicare Other | Admitting: *Deleted

## 2019-04-28 DIAGNOSIS — Z955 Presence of coronary angioplasty implant and graft: Secondary | ICD-10-CM

## 2019-04-28 DIAGNOSIS — I214 Non-ST elevation (NSTEMI) myocardial infarction: Secondary | ICD-10-CM

## 2019-04-28 NOTE — Progress Notes (Signed)
Daily Session Note  Patient Details  Name: Katelyn Manning MRN: [id number] Date of Birth: [birth year] Referring Provider:     Cardiac Rehab from 04/20/2019 in [facility] Card[facility] and [facility] Re[facility]  Referring Provider  Paraschos      Encounter Date: 04/28/2019  Check In: Session Check In - 04/28/19 0847      Check-In   Supervising physician immediately available to respond to emergencies  See telemetry face sheet for immediately available ER MD    Location  [facility]    Staff Present  Alberteen Sam, MA, RCEP, CCRP, Kathyrn Drown, RN BSN;Joseph Lyles [employer]    Virtual Visit  No    Medication changes reported      No    Fall or balance concerns reported     No    Tobacco Cessation  No Change    Warm-up and Cool-down  Performed on first and last piece of equipment    Resistance Training Performed  Yes    VAD Patient?  No    PAD/SET Patient?  No      Pain Assessment   Currently in Pain?  No/denies          Social History   Tobacco Use  Smoking Status Never Smoker  Smokeless Tobacco Never Used    Goals Met:  Independence with exercise equipment Exercise tolerated well No report of cardiac concerns or symptoms Strength training completed today  Goals Unmet:  Not Applicable  Comments: Pt able to follow exercise prescription today without complaint.  Will continue to monitor for progression.    Dr. Emily Filbert is Medical Director for [place] and [facility].

## 2019-05-01 IMAGING — MG DIGITAL SCREENING BILATERAL MAMMOGRAM WITH TOMO AND CAD
8 series · 8 of 24 positions shown · non-contrast
Comparison: Previous exam(s).

CLINICAL DATA: Screening. History of LEFT breast cancer and
lumpectomy in 7977

EXAM:
DIGITAL SCREENING BILATERAL MAMMOGRAM WITH TOMO AND CAD

[L MLO synth-2D]
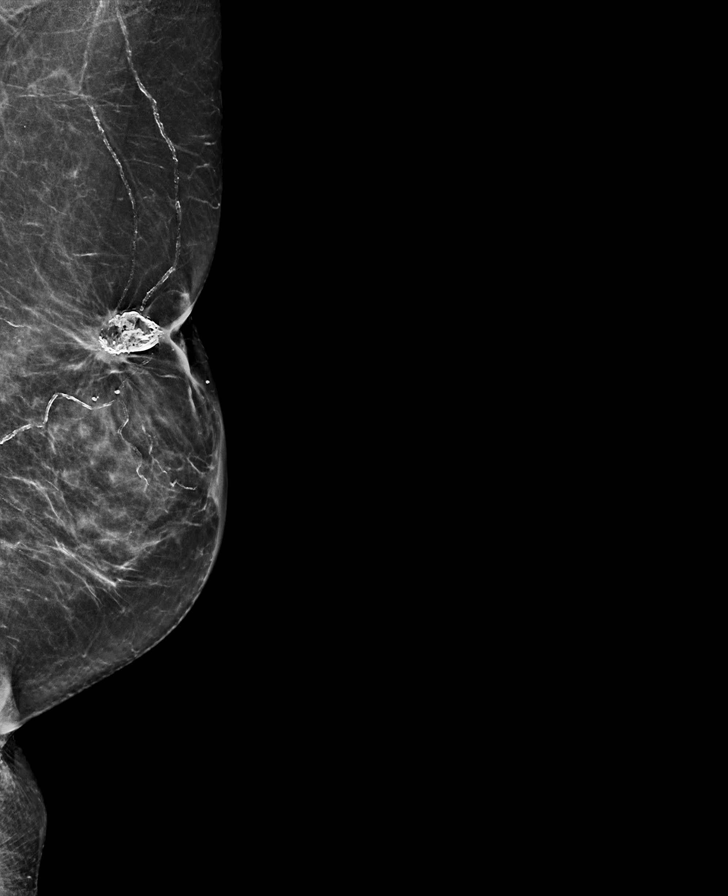

[L CC synth-2D]
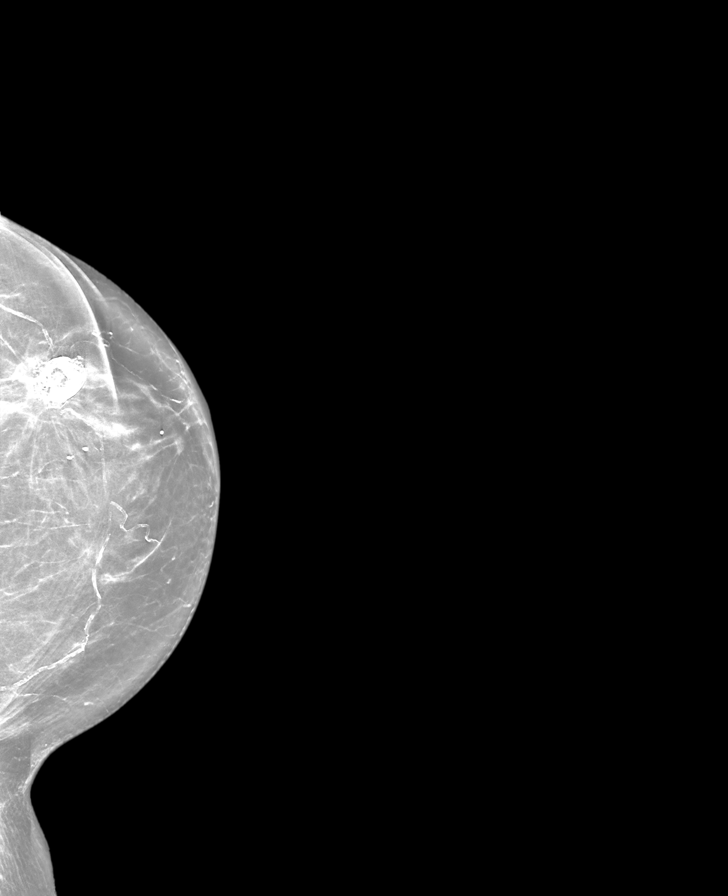

[R MLO synth-2D]
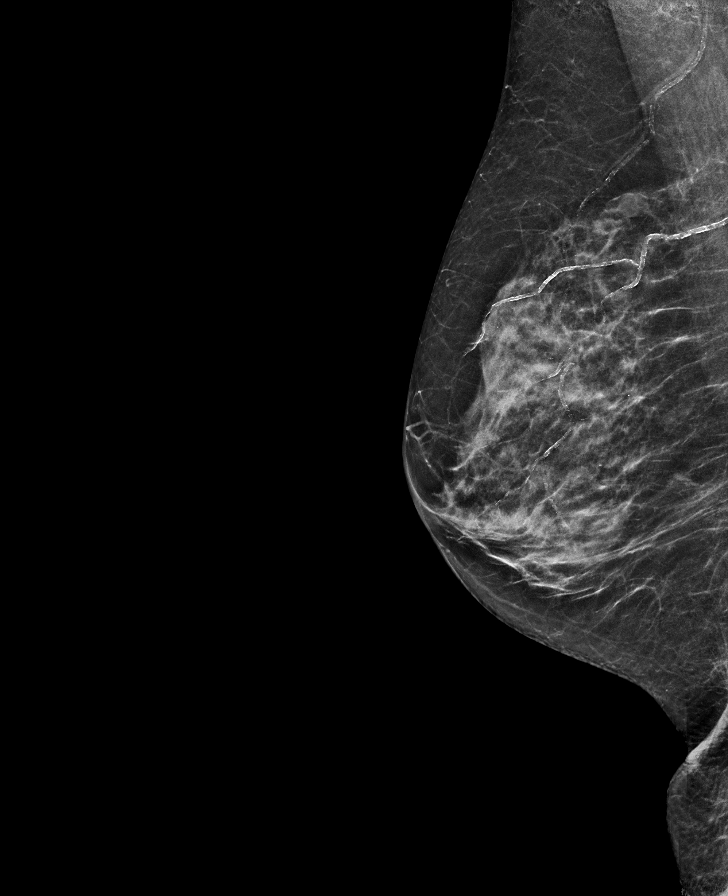

[R CC synth-2D]
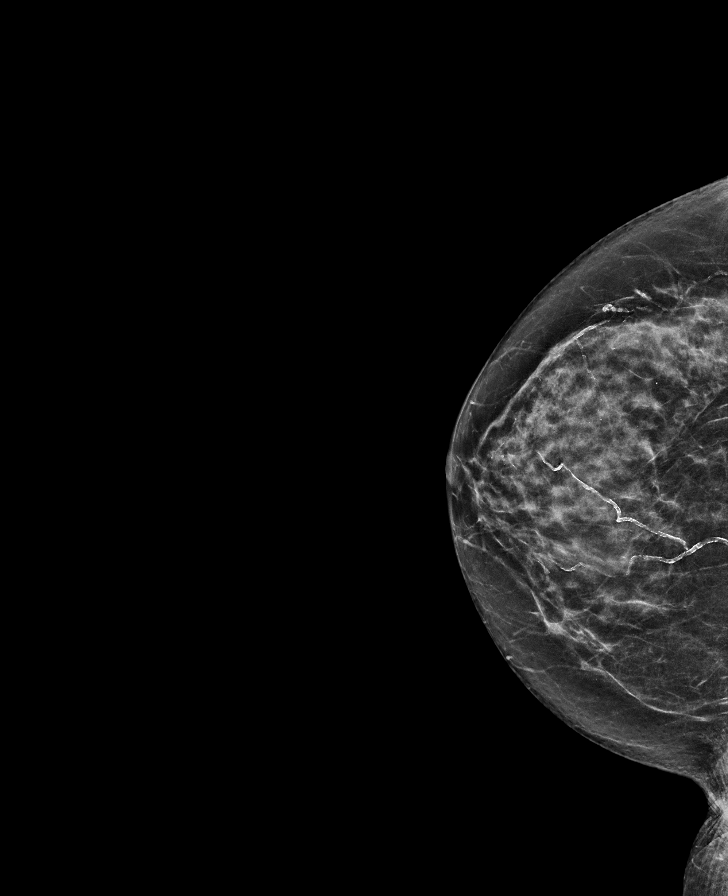

[R MLO tomo · tomo slice 29/57.0]
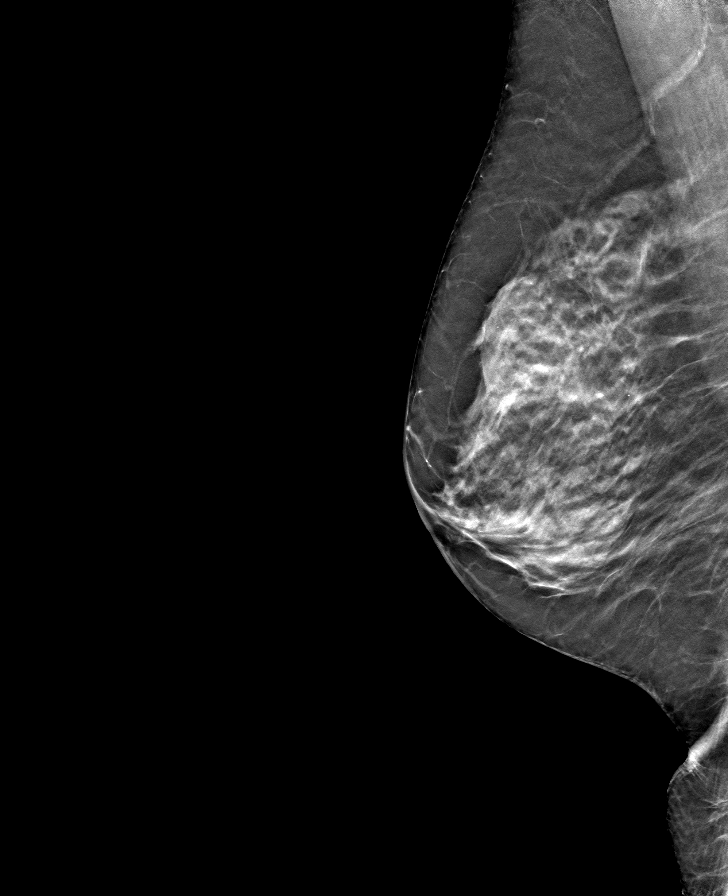

[L MLO tomo · tomo slice 33/64.0]
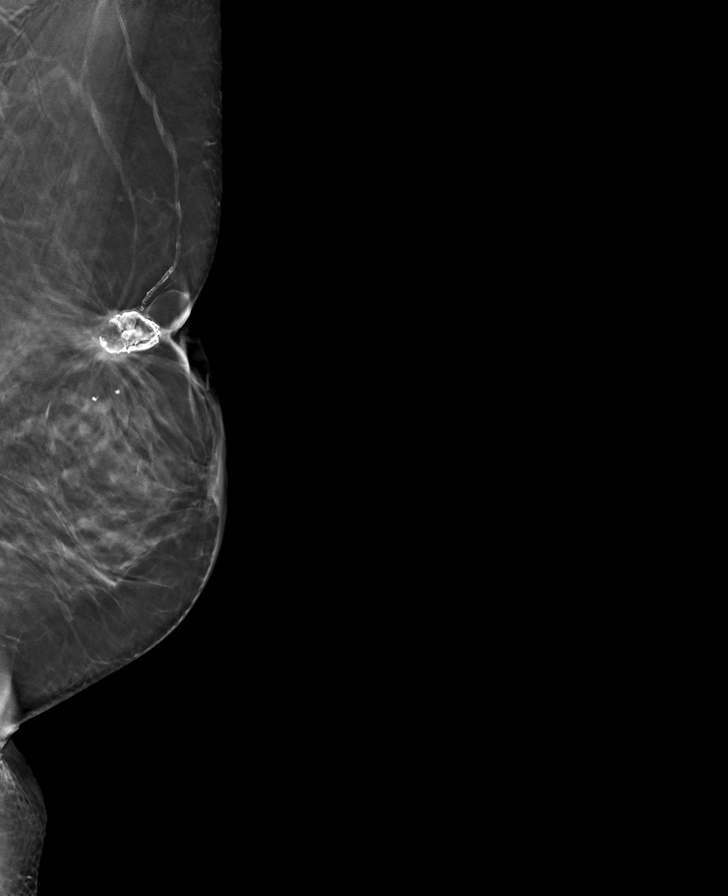

[R CC tomo · tomo slice 30/59.0]
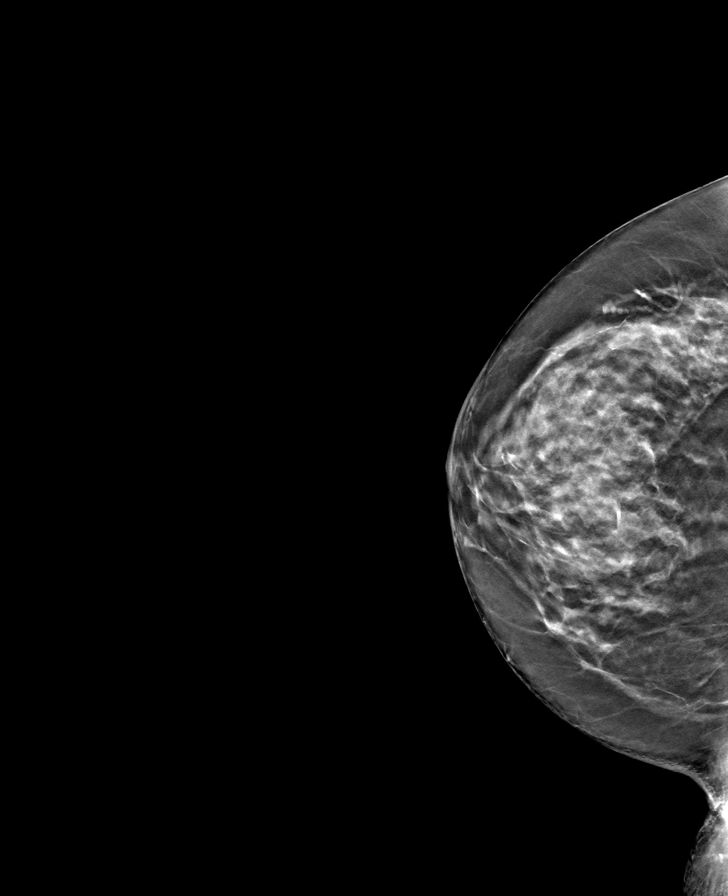

[L CC tomo · tomo slice 37/72.0]
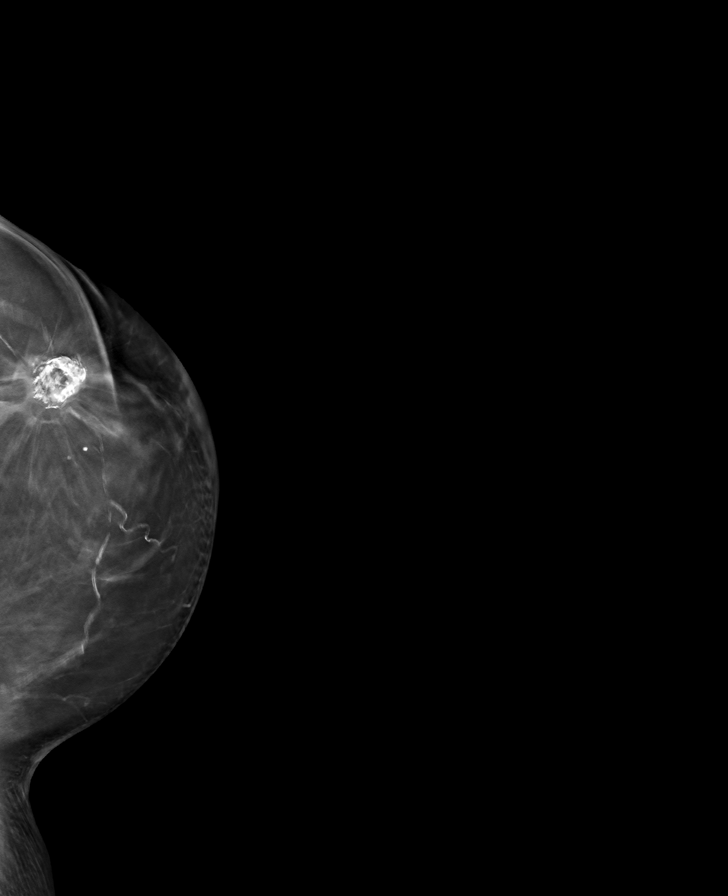

[8 of 24 positions shown; findings below may reference images not displayed]

ACR Breast Density Category c: The breast tissue is heterogeneously
dense, which may obscure small masses.
FINDINGS: There are no findings suspicious for malignancy.

LEFT lumpectomy changes again noted.

Images were processed with CAD.
IMPRESSION: No mammographic evidence of malignancy. A result letter of this
screening mammogram will be mailed directly to the patient.

RECOMMENDATION:
Screening mammogram in one year. (Code:9K-N-S3A)

BI-RADS CATEGORY  2: Benign.

## 2019-05-04 ENCOUNTER — Other Ambulatory Visit: Payer: Self-pay

## 2019-05-04 ENCOUNTER — Encounter: Payer: Medicare Other | Admitting: *Deleted

## 2019-05-04 DIAGNOSIS — Z955 Presence of coronary angioplasty implant and graft: Secondary | ICD-10-CM

## 2019-05-04 DIAGNOSIS — I214 Non-ST elevation (NSTEMI) myocardial infarction: Secondary | ICD-10-CM

## 2019-05-04 NOTE — Progress Notes (Signed)
Daily Session Note  Patient Details  Name: ROBBY PIRANI MRN: [id number] Date of Birth: [birth year] Referring Provider:     Cardiac Rehab from 04/20/2019 in [facility] Re[facility]  Referring Provider  Paraschos      Encounter Date: 05/04/2019  Check In: Session Check In - 05/04/19 0852      Check-In   Supervising physician immediately available to respond to emergencies  See telemetry face sheet for immediately available ER MD    Location  [facility]    Staff Present  Heath Lark, RN, BSN, Lance Sell, BA, ACSM CEP, Exercise Physiologist;Joseph Hood RCP,RRT,BSRT    Virtual Visit  No    Medication changes reported      No    Fall or balance concerns reported     No    Warm-up and Cool-down  Performed on first and last piece of equipment    Resistance Training Performed  Yes    VAD Patient?  No    PAD/SET Patient?  No      Pain Assessment   Currently in Pain?  No/denies          Social History   Tobacco Use  Smoking Status Never Smoker  Smokeless Tobacco Never Used    Goals Met:  Independence with exercise equipment Exercise tolerated well No report of cardiac concerns or symptoms  Goals Unmet:  Not Applicable  Comments: Pt able to follow exercise prescription today without complaint.  Will continue to monitor for progression.    Dr. Emily Filbert is Medical Director for [place] and [facility].

## 2019-05-05 ENCOUNTER — Encounter: Payer: Medicare Other | Admitting: *Deleted

## 2019-05-05 DIAGNOSIS — Z955 Presence of coronary angioplasty implant and graft: Secondary | ICD-10-CM | POA: Diagnosis not present

## 2019-05-05 DIAGNOSIS — I214 Non-ST elevation (NSTEMI) myocardial infarction: Secondary | ICD-10-CM

## 2019-05-05 NOTE — Progress Notes (Signed)
Daily Session Note  Patient Details  Name: ANWEN CANNEDY MRN: [id number] Date of Birth: [birth year] Referring Provider:     Cardiac Rehab from 04/20/2019 in [facility] Re[facility]  Referring Provider  Paraschos      Encounter Date: 05/05/2019  Check In: Session Check In - 05/05/19 0845      Check-In   Supervising physician immediately available to respond to emergencies  See telemetry face sheet for immediately available ER MD    Location  [facility]    Staff Present  Heath Lark, RN, BSN, CCRP;Jessica Donaldson, MA, RCEP, CCRP, CCET;Joseph Sycamore RCP,RRT,BSRT    Virtual Visit  No    Medication changes reported      No    Fall or balance concerns reported     No    Warm-up and Cool-down  Performed on first and last piece of equipment    Resistance Training Performed  Yes    VAD Patient?  No    PAD/SET Patient?  No      Pain Assessment   Currently in Pain?  No/denies          Social History   Tobacco Use  Smoking Status Never Smoker  Smokeless Tobacco Never Used    Goals Met:  Independence with exercise equipment Exercise tolerated well No report of cardiac concerns or symptoms  Goals Unmet:  Not Applicable  Comments: Pt able to follow exercise prescription today without complaint.  Will continue to monitor for progression.    Dr. Emily Filbert is Medical Director for [place] and [facility].

## 2019-05-11 ENCOUNTER — Encounter: Payer: Medicare Other | Admitting: *Deleted

## 2019-05-11 ENCOUNTER — Other Ambulatory Visit: Payer: Self-pay

## 2019-05-11 DIAGNOSIS — Z955 Presence of coronary angioplasty implant and graft: Secondary | ICD-10-CM

## 2019-05-11 DIAGNOSIS — I214 Non-ST elevation (NSTEMI) myocardial infarction: Secondary | ICD-10-CM

## 2019-05-11 NOTE — Progress Notes (Signed)
Daily Session Note  Patient Details  Name: Katelyn Manning MRN: [id number] Date of Birth: 1950-10-20 Referring Provider:     Cardiac Rehab from 04/20/2019 in [facility] Card[facility] Rehab  Referring Provider  Paraschos      Encounter Date: 05/11/2019  Check In: Session Check In - 05/11/19 0844      Check-In   Supervising physician immediately available to respond to emergencies  See telemetry face sheet for immediately available ER MD    Location  [facility]    Staff Present  Nada Maclachlan, BA, ACSM CEP, Exercise Physiologist;Krista Frederico Hamman, RN BSN;Joseph Hood RCP,RRT,BSRT    Virtual Visit  No    Medication changes reported      No    Fall or balance concerns reported     No    Warm-up and Cool-down  Performed on first and last piece of equipment    Resistance Training Performed  Yes    VAD Patient?  No    PAD/SET Patient?  No      Pain Assessment   Currently in Pain?  No/denies          Social History   Tobacco Use  Smoking Status Never Smoker  Smokeless Tobacco Never Used    Goals Met:  Independence with exercise equipment Exercise tolerated well No report of cardiac concerns or symptoms Strength training completed today  Goals Unmet:  Not Applicable  Comments: Pt able to follow exercise prescription today without complaint.  Will continue to monitor for progression.    Dr. Emily Filbert is Medical Director for [place] and [facility].

## 2019-05-12 ENCOUNTER — Other Ambulatory Visit: Payer: Self-pay

## 2019-05-12 ENCOUNTER — Encounter: Payer: Medicare Other | Admitting: *Deleted

## 2019-05-12 DIAGNOSIS — Z955 Presence of coronary angioplasty implant and graft: Secondary | ICD-10-CM

## 2019-05-12 DIAGNOSIS — I214 Non-ST elevation (NSTEMI) myocardial infarction: Secondary | ICD-10-CM

## 2019-05-12 NOTE — Progress Notes (Signed)
Daily Session Note  Patient Details  Name: Katelyn Manning MRN: [id number] Date of Birth: 1951-01-07 Referring Provider:     Cardiac Rehab from 04/20/2019 in [facility]  Referring Provider  Paraschos      Encounter Date: 05/12/2019  Check In: Session Check In - 05/12/19 0846      Check-In   Supervising physician immediately available to respond to emergencies  See telemetry face sheet for immediately available ER MD    Location  [facility]    Staff Present  Darel Hong, RN BSN;Jessica Luan Pulling, MA, RCEP, CCRP, CCET;Joseph Sims RCP,RRT,BSRT    Virtual Visit  No    Medication changes reported      No    Fall or balance concerns reported     No    Warm-up and Cool-down  Performed on first and last piece of equipment    Resistance Training Performed  Yes    VAD Patient?  No    PAD/SET Patient?  No      Pain Assessment   Currently in Pain?  No/denies          Social History   Tobacco Use  Smoking Status Never Smoker  Smokeless Tobacco Never Used    Goals Met:  Independence with exercise equipment Exercise tolerated well No report of cardiac concerns or symptoms Strength training completed today  Goals Unmet:  Not Applicable  Comments: Pt able to follow exercise prescription today without complaint.  Will continue to monitor for progression.    Dr. Emily Filbert is Medical Director for [place] and [facility].

## 2019-05-18 ENCOUNTER — Encounter: Payer: Medicare Other | Attending: Cardiology

## 2019-05-18 DIAGNOSIS — Z955 Presence of coronary angioplasty implant and graft: Secondary | ICD-10-CM | POA: Insufficient documentation

## 2019-05-18 DIAGNOSIS — I214 Non-ST elevation (NSTEMI) myocardial infarction: Secondary | ICD-10-CM | POA: Insufficient documentation

## 2019-05-19 ENCOUNTER — Encounter: Payer: Self-pay | Admitting: *Deleted

## 2019-05-19 DIAGNOSIS — I214 Non-ST elevation (NSTEMI) myocardial infarction: Secondary | ICD-10-CM

## 2019-05-19 DIAGNOSIS — Z955 Presence of coronary angioplasty implant and graft: Secondary | ICD-10-CM

## 2019-05-19 NOTE — Progress Notes (Signed)
Cardiac Individual Treatment Plan  Patient Details  Name: Katelyn Manning MRN: [id number] Date of Birth: 03/25/1951 Referring Provider:     Cardiac Rehab from 04/20/2019 in [facility] Cardiac and Pulmonary Rehab  Referring Provider  Paraschos      Initial Encounter Date:    Cardiac Rehab from 04/20/2019 in [facility] Cardiac and Pulmonary Rehab  Date  04/20/19      Visit Diagnosis: NSTEMI (non-ST elevated myocardial infarction) [facility])  Status post coronary artery stent placement  Patient's Home Medications on Admission:  Current Outpatient Medications:  .  aspirin 81 MG chewable tablet, Chew 81 mg by mouth daily. , Disp: , Rfl:  .  Calcium Carbonate-Vitamin D (CALCIUM 600+D) 600-200 MG-UNIT TABS, Take 1 tablet by mouth daily. , Disp: , Rfl:  .  carvedilol (COREG) 6.25 MG tablet, Take 1 tablet (6.25 mg total) by mouth 2 (two) times daily., Disp: 60 tablet, Rfl: 11 .  Cholecalciferol (VITAMIN D3) 2000 UNITS capsule, Take 2,000 Units by mouth daily. , Disp: , Rfl:  .  clopidogrel (PLAVIX) 75 MG tablet, Take 1 tablet (75 mg total) by mouth daily with breakfast., Disp: 30 tablet, Rfl: 3 .  denosumab (PROLIA) 60 MG/ML SOLN injection, Inject 60 mg into the skin every 6 (six) months. Administer in upper arm, thigh, or abdomen, Disp: , Rfl:  .  levothyroxine (SYNTHROID) 75 MCG tablet, Take 75 mcg by mouth daily. Take on an empty stomach with a glass of water at least 30-60 minutes before breakfast, Disp: , Rfl:  .  losartan (COZAAR) 25 MG tablet, Take by mouth., Disp: , Rfl:  .  Magnesium (V-R MAGNESIUM) 250 MG TABS, Take 250 mg by mouth daily., Disp: , Rfl:  .  Multiple Vitamin (MULTI-VITAMINS) TABS, Take 1 tablet by mouth daily. , Disp: , Rfl:  .  Multiple Vitamins-Minerals (ICAPS AREDS 2) CAPS, Take 2 capsules by mouth 2 (two) times daily at 8 am and 10 pm., Disp: , Rfl:  .  omeprazole (PRILOSEC) 40 MG capsule, Take 40 mg by mouth 2 (two) times daily., Disp: , Rfl:  .  simvastatin (ZOCOR) 80 MG tablet,  Take 1 tablet (80 mg total) by mouth every evening., Disp: 30 tablet, Rfl: 11 .  sucralfate (CARAFATE) 1 GM/10ML suspension, Take 10 mLs by mouth 4 (four) times daily., Disp: , Rfl:   Past Medical History: Past Medical History:  Diagnosis Date  . 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 Carcinoma of overlapping sites of left breast in female, estrogen receptor positive ([place])   . DVT (deep venous thrombosis) ([place])   . DVT of leg (deep venous thrombosis) ([place]) 1970   while on birth control  . History of left breast cancer 03/25/2010   stage 1, clinical infiltrating carcinoma, tumor size 0.9 cm; margins clear, lymph nodes negative, ER/PR positive/HER2 negative  . History of radiation therapy   . History of spinal fracture   . Hyperlipidemia   . Hypertension   . Hypothyroidism   . Macular degeneration, wet ([place])   . Osteoporosis   . Personal history of radiation therapy 2011   left breast  . Psoriasis     Tobacco Use: Social History   Tobacco Use  Smoking Status Never Smoker  Smokeless Tobacco Never Used    Labs: Recent Review Flowsheet Data    Labs for ITP Cardiac and Pulmonary Rehab Latest Ref Rng & Units 03/25/2019   Cholestrol 0 - 200 mg/dL 120  LDLCALC 0 - 99 mg/dL 62   HDL >40 mg/dL 38(L)   Trlycerides <150 mg/dL 102       Exercise Target Goals: Exercise Program Goal: Individual exercise prescription set using results from initial 6 min walk test and THRR while considering  patient's activity barriers and safety.   Exercise Prescription Goal: Initial exercise prescription builds to 30-45 minutes a day of aerobic activity, 2-3 days per week.  Home exercise guidelines will be given to patient during program as part of exercise prescription that the participant will acknowledge.  Activity Barriers & Risk Stratification: Activity Barriers & Cardiac Risk Stratification - 04/15/19 1034      Activity Barriers & Cardiac Risk  Stratification   Activity Barriers  Arthritis    Cardiac Risk Stratification  Moderate       6 Minute Walk: 6 Minute Walk    Row Name 04/20/19 1113         6 Minute Walk   Phase  Initial     Distance  1500 feet     Walk Time  6 minutes     # of Rest Breaks  0     MPH  2.84     METS  3.21     RPE  11     Perceived Dyspnea   1     VO2 Peak  11.26     Symptoms  No     Resting HR  69 bpm     Resting BP  160/80     Max Ex. HR  115 bpm     Max Ex. BP  164/80     2 Minute Post BP  140/80        Oxygen Initial Assessment:   Oxygen Re-Evaluation:   Oxygen Discharge (Final Oxygen Re-Evaluation):   Initial Exercise Prescription: Initial Exercise Prescription - 04/20/19 1100      Date of Initial Exercise RX and Referring Provider   Date  04/20/19    Referring Provider  Paraschos      Treadmill   MPH  2.5    Grade  1    Minutes  15    METs  2      Recumbant Bike   Level  2    RPM  50    Watts  20    Minutes  15    METs  2      Arm Ergometer   Level  1    Watts  10    RPM  15    Minutes  15    METs  1.5      REL-XR   Level  2    Watts  20    Speed  50    Minutes  15    METs  2      T5 Nustep   Level  1    SPM  50    Minutes  15    METs  2      Biostep-RELP   Level  1    SPM  60    Minutes  15    METs  2      Prescription Details   Frequency (times per week)  3    Duration  Progress to 30 minutes of continuous aerobic without signs/symptoms of physical distress      Intensity   THRR 40-80% of Max Heartrate  101-133    Ratings of Perceived Exertion  11-15    Perceived  Dyspnea  0-4      Progression   Progression  Continue progressive overload as per policy without signs/symptoms or physical distress.      Resistance Training   Training Prescription  Yes    Weight  3    Reps  10-15       Perform Capillary Blood Glucose checks as needed.  Exercise Prescription Changes: Exercise Prescription Changes    Row Name 04/29/19 1300  05/04/19 1400 05/11/19 1000         Response to Exercise   Blood Pressure (Admit)  120/60  -  146/62     Blood Pressure (Exercise)  126/64  -  142/62     Blood Pressure (Exit)  134/70  -  122/64     Heart Rate (Admit)  68 bpm  -  88 bpm     Heart Rate (Exercise)  115 bpm  -  113 bpm     Heart Rate (Exit)  86 bpm  -  94 bpm     Rating of Perceived Exertion (Exercise)  15  -  13     Symptoms  none  -  none     Duration  -  -  Continue with 30 min of aerobic exercise without signs/symptoms of physical distress.     Intensity  -  -  THRR unchanged       Progression   Progression  Continue to progress workloads to maintain intensity without signs/symptoms of physical distress.  -  Continue to progress workloads to maintain intensity without signs/symptoms of physical distress.     Average METs  3  -  2.31       Resistance Training   Training Prescription  Yes  -  Yes     Weight  3 lb  -  3 lbs     Reps  10-15  -  10-15       Interval Training   Interval Training  No  -  No       Recumbant Bike   Level  2  -  2     RPM  50  -  -     Watts  22  -  22     Minutes  15  -  15     METs  3  -  3.14       Arm Ergometer   Level  -  -  1     Minutes  -  -  15     METs  -  -  2.1       Biostep-RELP   Level  1  -  1     SPM  60  -  -     Minutes  15  -  15     METs  3  -  2       Home Exercise Plan   Plans to continue exercise at  -  [facility] (comment)  [facility]     Frequency  -  Add 3 additional days to program exercise sessions.  Add 3 additional days to program exercise sessions.     Initial Home Exercises Provided  -  05/04/19  05/04/19        Exercise Comments: Exercise Comments    Row Name 04/21/19 0911 05/04/19 1447         Exercise Comments  First full day of exercise!  Patient was oriented to gym  and equipment including functions, settings, policies, and procedures.  Patient's individual exercise prescription and treatment plan were  reviewed.  All starting workloads were established based on the results of the 6 minute walk test done at initial orientation visit.  The plan for exercise progression was also introduced and progression will be customized based on patient's performance and goals.  Reviewed home exercise - Malissa exercises at [facility] and walks at home.  She will check her HR/RPE during exercise         Exercise Goals and Review: Exercise Goals    Row Name 04/20/19 1119 05/04/19 1448           Exercise Goals   Increase Physical Activity  Yes  -      Intervention  Provide advice, education, support and counseling about physical activity/exercise needs.;Develop an individualized exercise prescription for aerobic and resistive training based on initial evaluation findings, risk stratification, comorbidities and participant's personal goals.  Provide advice, education, support and counseling about physical activity/exercise needs.;Develop an individualized exercise prescription for aerobic and resistive training based on initial evaluation findings, risk stratification, comorbidities and participant's personal goals.      Expected Outcomes  Short Term: Attend rehab on a regular basis to increase amount of physical activity.;Long Term: Add in home exercise to make exercise part of routine and to increase amount of physical activity.;Long Term: Exercising regularly at least 3-5 days a week.  -      Increase Strength and Stamina  Yes  -      Intervention  Provide advice, education, support and counseling about physical activity/exercise needs.;Develop an individualized exercise prescription for aerobic and resistive training based on initial evaluation findings, risk stratification, comorbidities and participant's personal goals.  -      Expected Outcomes  Short Term: Increase workloads from initial exercise prescription for resistance, speed, and METs.;Short Term: Perform resistance training exercises routinely during  rehab and add in resistance training at home;Long Term: Improve cardiorespiratory fitness, muscular endurance and strength as measured by increased METs and functional capacity (6MWT)  -      Able to understand and use rate of perceived exertion (RPE) scale  Yes  -      Intervention  Provide education and explanation on how to use RPE scale  -      Expected Outcomes  Short Term: Able to use RPE daily in rehab to express subjective intensity level;Long Term:  Able to use RPE to guide intensity level when exercising independently  -      Able to understand and use Dyspnea scale  Yes  -      Intervention  Provide education and explanation on how to use Dyspnea scale  -      Expected Outcomes  Short Term: Able to use Dyspnea scale daily in rehab to express subjective sense of shortness of breath during exertion;Long Term: Able to use Dyspnea scale to guide intensity level when exercising independently  -      Knowledge and understanding of Target Heart Rate Range (THRR)  Yes  -      Intervention  Provide education and explanation of THRR including how the numbers were predicted and where they are located for reference  -      Expected Outcomes  Short Term: Able to state/look up THRR;Long Term: Able to use THRR to govern intensity when exercising independently;Short Term: Able to use daily as guideline for intensity in rehab  -  Able to check pulse independently  Yes  -      Intervention  Provide education and demonstration on how to check pulse in carotid and radial arteries.;Review the importance of being able to check your own pulse for safety during independent exercise  -      Expected Outcomes  Short Term: Able to explain why pulse checking is important during independent exercise;Long Term: Able to check pulse independently and accurately  -      Understanding of Exercise Prescription  Yes  -      Intervention  Provide education, explanation, and written materials on patient's individual exercise  prescription  -      Expected Outcomes  Short Term: Able to explain program exercise prescription;Long Term: Able to explain home exercise prescription to exercise independently  -         Exercise Goals Re-Evaluation : Exercise Goals Re-Evaluation    Row Name 04/21/19 0911 04/29/19 1309 05/04/19 1448 05/11/19 1053       Exercise Goal Re-Evaluation   Exercise Goals Review  Increase Strength and Stamina;Increase Physical Activity;Able to understand and use rate of perceived exertion (RPE) scale;Able to understand and use Dyspnea scale;Knowledge and understanding of Target Heart Rate Range (THRR);Understanding of Exercise Prescription  Increase Physical Activity;Increase Strength and Stamina;Able to understand and use rate of perceived exertion (RPE) scale;Knowledge and understanding of Target Heart Rate Range (THRR);Able to check pulse independently;Understanding of Exercise Prescription  Increase Physical Activity;Increase Strength and Stamina;Able to understand and use rate of perceived exertion (RPE) scale;Knowledge and understanding of Target Heart Rate Range (THRR);Able to check pulse independently;Understanding of Exercise Prescription  Increase Physical Activity;Increase Strength and Stamina;Understanding of Exercise Prescription    Comments  Reviewed RPE scale, THR and program prescription with pt today.  Pt voiced understanding and was given a copy of goals to take home.  Nelma has done well so far in rehab.  Staff will monitor progress.  Mattea exercises at [employer] or walks on days not at rehab.  She tried the arm crank today and it was challenging for her.  She wants to work on upper body strength  Livana is off to a good start in rehab.  She has completed 6 full exercise days.  She is already on 2 METs for the BioStep. We will continue to monitor her progress.    Expected Outcomes  Short: Use RPE daily to regulate intensity. Long: Follow program prescription in THR.  Short - attend consistently  Long - increase overall MET level  Short - continue exercise at [facility] and outside Long - increase upper body strength  Short: Increase resistance on BioStep and NuStep.  Long: Continue to build stamina.       Discharge Exercise Prescription (Final Exercise Prescription Changes): Exercise Prescription Changes - 05/11/19 1000      Response to Exercise   Blood Pressure (Admit)  146/62    Blood Pressure (Exercise)  142/62    Blood Pressure (Exit)  122/64    Heart Rate (Admit)  88 bpm    Heart Rate (Exercise)  113 bpm    Heart Rate (Exit)  94 bpm    Rating of Perceived Exertion (Exercise)  13    Symptoms  none    Duration  Continue with 30 min of aerobic exercise without signs/symptoms of physical distress.    Intensity  THRR unchanged      Progression   Progression  Continue to progress workloads to maintain intensity without signs/symptoms of  physical distress.    Average METs  2.31      Resistance Training   Training Prescription  Yes    Weight  3 lbs    Reps  10-15      Interval Training   Interval Training  No      Recumbant Bike   Level  2    Watts  22    Minutes  15    METs  3.14      Arm Ergometer   Level  1    Minutes  15    METs  2.1      Biostep-RELP   Level  1    Minutes  15    METs  2      Home Exercise Plan   Plans to continue exercise at  [employer] (comment)    Frequency  Add 3 additional days to program exercise sessions.    Initial Home Exercises Provided  05/04/19       Nutrition:  Target Goals: Understanding of nutrition guidelines, daily intake of sodium <1512m, cholesterol <2065m calories 30% from fat and 7% or less from saturated fats, daily to have 5 or more servings of fruits and vegetables.  Biometrics:    Nutrition Therapy Plan and Nutrition Goals: Nutrition Therapy & Goals - 04/20/19 1104      Nutrition Therapy   Diet  Low Na, HH diet    Protein (specify units)  45-50g    Fiber  25 grams    Whole Grain Foods  3 servings     Saturated Fats  12 max. grams    Fruits and Vegetables  5 servings/day    Sodium  1.5 grams      Personal Nutrition Goals   Nutrition Goal  ST/LT: continue HHLyonating and stay healthy    Comments  Pt reports being on slimfast diet which her doctor told her is healthy as her doctor and her agreed she needed to lose weight. Discussed how BMI is not the end all be all. Mentioned that evidence shows it is most beneficial to get our vitmains and minerals from foods like whole grains, fruits and vegetables, legumes, nuts/seeds and not from supplements; supplements are meant to supplement healthy diet, not replace. food provide fiber, are more emotionally satiating, and have synergistic effects as well as giving a variety of phytonutrients. Pt reports having 2 slimfast shakes a day, some snacks like nuts and fruit during the day, and cereal in the morning, discussed how that may not be enough calories in a day for her needs and to support her workouts. Discussed how she may gain weight while here due to the exercise and strength gain. Pt reports on weekend will "cheat" and have vegetable omelette, muffin, and oatmeal: something small for lunch like a taco at a fast food restaurant: and meat with vegetbales for dinner. Pt reports will sometimes have a candy bar. Pt reports not eating much. Discussed HH eating - RD flagged some disordered eating talk; monitor.      Intervention Plan   Intervention  Prescribe, educate and counsel regarding individualized specific dietary modifications aiming towards targeted core components such as weight, hypertension, lipid management, diabetes, heart failure and other comorbidities.;Nutrition handout(s) given to patient.    Expected Outcomes  Short Term Goal: Understand basic principles of dietary content, such as calories, fat, sodium, cholesterol and nutrients.;Short Term Goal: A plan has been developed with personal nutrition goals set during dietitian appointment.;Long  Term Goal:  Adherence to prescribed nutrition plan.       Nutrition Assessments:   Nutrition Goals Re-Evaluation: Nutrition Goals Re-Evaluation    [place] Name 05/11/19 0855             Goals   Nutrition Goal  ST/LT: continue with current diet       Comment  Pt does not want to make any changes. Pt woule like to continue with current diet.       Expected Outcome  ST/LT: continue with current diet          Nutrition Goals Discharge (Final Nutrition Goals Re-Evaluation): Nutrition Goals Re-Evaluation - 05/11/19 0855      Goals   Nutrition Goal  ST/LT: continue with current diet    Comment  Pt does not want to make any changes. Pt woule like to continue with current diet.    Expected Outcome  ST/LT: continue with current diet       Psychosocial: Target Goals: Acknowledge presence or absence of significant depression and/or stress, maximize coping skills, provide positive support system. Participant is able to verbalize types and ability to use techniques and skills needed for reducing stress and depression.   Initial Review & Psychosocial Screening: Initial Psych Review & Screening - 04/15/19 1035      Initial Review   Current issues with  None Identified      Family Dynamics   Good Support System?  Yes   husband, daughter     Barriers   Psychosocial barriers to participate in program  There are no identifiable barriers or psychosocial needs.;The patient should benefit from training in stress management and relaxation.      Screening Interventions   Interventions  Encouraged to exercise;To provide support and resources with identified psychosocial needs;Provide feedback about the scores to participant    Expected Outcomes  Short Term goal: Utilizing psychosocial counselor, staff and physician to assist with identification of specific Stressors or current issues interfering with healing process. Setting desired goal for each stressor or current issue identified.;Long Term  Goal: Stressors or current issues are controlled or eliminated.;Short Term goal: Identification and review with participant of any Quality of Life or Depression concerns found by scoring the questionnaire.;Long Term goal: The participant improves quality of Life and PHQ9 Scores as seen by post scores and/or verbalization of changes       Quality of Life Scores:  Quality of Life - 04/20/19 1121      Quality of Life   Select  Quality of Life      Scores of 19 and below usually indicate a poorer quality of life in these areas.  A difference of  2-3 points is a clinically meaningful difference.  A difference of 2-3 points in the total score of the Quality of Life Index has been associated with significant improvement in overall quality of life, self-image, physical symptoms, and general health in studies assessing change in quality of life.  PHQ-9: Recent Review Flowsheet Data    There is no flowsheet data to display.     Interpretation of Total Score  Total Score Depression Severity:  1-4 = Minimal depression, 5-9 = Mild depression, 10-14 = Moderate depression, 15-19 = Moderately severe depression, 20-27 = Severe depression   Psychosocial Evaluation and Intervention:   Psychosocial Re-Evaluation: Psychosocial Re-Evaluation    Row Name 05/05/19 0848             Psychosocial Re-Evaluation   Current issues with  Current Stress Concerns  Comments  Ronneisha has a daughter that is sick and has seizures from time to time. Her daughters husband shot himself last year in from of her. Her daughters daughter has a drug problem and is something that is on her mind. Kelli tries to stay positive with her husband and do things. Her family works to [employer] in [place] quality scientist a Proofreader. She is going to retire this year.       Expected Outcomes  Short: continue to attend [facility]. Long: maintain a workout routine post [facility] to keep stress at a minimum.       Interventions  Encouraged to attend Cardiac  Rehabilitation for the exercise       Continue Psychosocial Services   Follow up required by staff          Psychosocial Discharge (Final Psychosocial Re-Evaluation): Psychosocial Re-Evaluation - 05/05/19 0848      Psychosocial Re-Evaluation   Current issues with  Current Stress Concerns    Comments  Louanne has a daughter that is sick and has seizures from time to time. Her daughters husband shot himself last year in from of her. Her daughters daughter has a drug problem and is something that is on her mind. Shifra tries to stay positive with her husband and do things. Her family works to [employer] in [place] scientist a Proofreader. She is going to retire this year.    Expected Outcomes  Short: continue to attend [facility]. Long: maintain a workout routine post [facility] to keep stress at a minimum.    Interventions  Encouraged to attend Cardiac Rehabilitation for the exercise    Continue Psychosocial Services   Follow up required by staff       Vocational Rehabilitation: Provide vocational rehab assistance to qualifying candidates.   Vocational Rehab Evaluation & Intervention: Vocational Rehab - 04/15/19 1035      Initial Vocational Rehab Evaluation & Intervention   Assessment shows need for Vocational Rehabilitation  No       Education: Education Goals: Education classes will be provided on a variety of topics geared toward better understanding of heart health and risk factor modification. Participant will state understanding/return demonstration of topics presented as noted by education test scores.  Learning Barriers/Preferences: Learning Barriers/Preferences - 04/15/19 1035      Learning Barriers/Preferences   Learning Barriers  None    Learning Preferences  Individual Instruction;Group Instruction       Education Topics:  AED/CPR: - Group verbal and written instruction with the use of models to demonstrate the basic use of the AED with the basic ABC's of resuscitation.   General  Nutrition Guidelines/Fats and Fiber: -Group instruction provided by verbal, written material, models and posters to present the general guidelines for heart healthy nutrition. Gives an explanation and review of dietary fats and fiber.   Controlling Sodium/Reading Food Labels: -Group verbal and written material supporting the discussion of sodium use in heart healthy nutrition. Review and explanation with models, verbal and written materials for utilization of the food label.   Exercise Physiology & General Exercise Guidelines: - Group verbal and written instruction with models to review the exercise physiology of the cardiovascular system and associated critical values. Provides general exercise guidelines with specific guidelines to those with heart or lung disease.    Aerobic Exercise & Resistance Training: - Gives group verbal and written instruction on the various components of exercise. Focuses on aerobic and resistive training programs and the benefits of this training and how to safely progress through these  programs..   Flexibility, Balance, Mind/Body Relaxation: Provides group verbal/written instruction on the benefits of flexibility and balance training, including mind/body exercise modes such as yoga, pilates and tai chi.  Demonstration and skill practice provided.   Stress and Anxiety: - Provides group verbal and written instruction about the health risks of elevated stress and causes of high stress.  Discuss the correlation between heart/lung disease and anxiety and treatment options. Review healthy ways to manage with stress and anxiety.   Depression: - Provides group verbal and written instruction on the correlation between heart/lung disease and depressed mood, treatment options, and the stigmas associated with seeking treatment.   Anatomy & Physiology of the Heart: - Group verbal and written instruction and models provide basic cardiac anatomy and physiology, with the  coronary electrical and arterial systems. Review of Valvular disease and Heart Failure   Cardiac Procedures: - Group verbal and written instruction to review commonly prescribed medications for heart disease. Reviews the medication, class of the drug, and side effects. Includes the steps to properly store meds and maintain the prescription regimen. (beta blockers and nitrates)   Cardiac Medications I: - Group verbal and written instruction to review commonly prescribed medications for heart disease. Reviews the medication, class of the drug, and side effects. Includes the steps to properly store meds and maintain the prescription regimen.   Cardiac Medications II: -Group verbal and written instruction to review commonly prescribed medications for heart disease. Reviews the medication, class of the drug, and side effects. (all other drug classes)    Go Sex-Intimacy & Heart Disease, Get SMART - Goal Setting: - Group verbal and written instruction through game format to discuss heart disease and the return to sexual intimacy. Provides group verbal and written material to discuss and apply goal setting through the application of the S.M.A.R.T. Method.   Other Matters of the Heart: - Provides group verbal, written materials and models to describe Stable Angina and Peripheral Artery. Includes description of the disease process and treatment options available to the cardiac patient.   Exercise & Equipment Safety: - Individual verbal instruction and demonstration of equipment use and safety with use of the equipment.   Cardiac Rehab from 04/20/2019 in [facility] Cardiac and Pulmonary Rehab  Date  04/20/19  Educator  [place]  Instruction Review Code  1- Verbalizes Understanding      Infection Prevention: - Provides verbal and written material to individual with discussion of infection control including proper hand washing and proper equipment cleaning during exercise session.   Cardiac Rehab from  04/20/2019 in [facility] Cardiac and Pulmonary Rehab  Date  04/20/19  Educator  [place]  Instruction Review Code  1- Verbalizes Understanding      Falls Prevention: - Provides verbal and written material to individual with discussion of falls prevention and safety.   Cardiac Rehab from 04/20/2019 in [facility] Cardiac and Pulmonary Rehab  Date  04/20/19  Educator  [place]  Instruction Review Code  1- Verbalizes Understanding      Diabetes: - Individual verbal and written instruction to review signs/symptoms of diabetes, desired ranges of glucose level fasting, after meals and with exercise. Acknowledge that pre and post exercise glucose checks will be done for 3 sessions at entry of program.   Know Your Numbers and Risk Factors: -Group verbal and written instruction about important numbers in your health.  Discussion of what are risk factors and how they play a role in the disease process.  Review of Cholesterol, Blood Pressure, Diabetes, and  BMI and the role they play in your overall health.   Sleep Hygiene: -Provides group verbal and written instruction about how sleep can affect your health.  Define sleep hygiene, discuss sleep cycles and impact of sleep habits. Review good sleep hygiene tips.    Other: -Provides group and verbal instruction on various topics (see comments)   Knowledge Questionnaire Score:   Core Components/Risk Factors/Patient Goals at Admission: Personal Goals and Risk Factors at Admission - 04/15/19 1037      Core Components/Risk Factors/Patient Goals on Admission   Hypertension  Yes    Intervention  Provide education on lifestyle modifcations including regular physical activity/exercise, weight management, moderate sodium restriction and increased consumption of fresh fruit, vegetables, and low fat dairy, alcohol moderation, and smoking cessation.;Monitor prescription use compliance.    Expected Outcomes  Short Term: Continued assessment and intervention until BP is < 140/22m  HG in hypertensive participants. < 130/833mHG in hypertensive participants with diabetes, heart failure or chronic kidney disease.;Long Term: Maintenance of blood pressure at goal levels.    Lipids  Yes    Intervention  Provide education and support for participant on nutrition & aerobic/resistive exercise along with prescribed medications to achieve LDL <7042mHDL >72m92m  Expected Outcomes  Short Term: Participant states understanding of desired cholesterol values and is compliant with medications prescribed. Participant is following exercise prescription and nutrition guidelines.;Long Term: Cholesterol controlled with medications as prescribed, with individualized exercise RX and with personalized nutrition plan. Value goals: LDL < 70mg25mL > 40 mg.       Core Components/Risk Factors/Patient Goals Review:  Goals and Risk Factor Review    Row Name 04/20/19 1120 05/05/19 0854           Core Components/Risk Factors/Patient Goals Review   Personal Goals Review  Hypertension;Lipids  Weight Management/Obesity;Lipids;Hypertension      Review  -  She takes medication for her cholesterol. Her blood pressure at home has been better. Her systolic is in the 120s-893T-342Apposed to 160s. She wants to lose 5 more pounds. She and her husband are trying to lose weight together and are well on their way.      Expected Outcomes  -  Short:attend [facility] regularly. Long: graduate [facility]         Core Components/Risk Factors/Patient Goals at Discharge (Final Review):  Goals and Risk Factor Review - 05/05/19 0854      Core Components/Risk Factors/Patient Goals Review   Personal Goals Review  Weight Management/Obesity;Lipids;Hypertension    Review  She takes medication for her cholesterol. Her blood pressure at home has been better. Her systolic is in the 120s-768T-157Wpposed to 160s. She wants to lose 5 more pounds. She and her husband are trying to lose weight together and are well on their way.     Expected Outcomes  Short:attend [facility] regularly. Long: graduate [facility]       ITP Comments: ITP Comments    Row Name 04/15/19 1043 04/21/19 0605 05/19/19 1100       ITP Comments  Virtual Initial orientation completed. Diagnosis can be found in [facility]. EP/RD orientation scheduled for 9/8 at 9:30  30 Day review. Continue with ITP unless directed changes per Medical Director review.  New to program  30 day review completed. ITP sent to Dr. Mark Emily Filbertical Director of Cardiac and Pulmonary Rehab. Continue with ITP unless changes are made by physician.  Department closed starting 10/2 until further notice by infection prevention  and Health at Work teams for [place].        Comments: 30 day review

## 2019-06-01 ENCOUNTER — Other Ambulatory Visit: Payer: Self-pay

## 2019-06-01 ENCOUNTER — Encounter: Payer: Medicare Other | Admitting: *Deleted

## 2019-06-01 DIAGNOSIS — Z955 Presence of coronary angioplasty implant and graft: Secondary | ICD-10-CM | POA: Diagnosis present

## 2019-06-01 DIAGNOSIS — I214 Non-ST elevation (NSTEMI) myocardial infarction: Secondary | ICD-10-CM

## 2019-06-01 NOTE — Progress Notes (Signed)
Daily Session Note  Patient Details  Name: Katelyn Manning MRN: [id number] Date of Birth: Oct 06, 1950 Referring Provider:     Cardiac Rehab from 04/20/2019 in [facility] Cardiac and Pulmonary Rehab  Referring Provider  [place]      Encounter Date: 06/01/2019  Check In: Session Check In - 06/01/19 0856      Check-In   Supervising physician immediately available to respond to emergencies  See telemetry face sheet for immediately available ER MD    Location  [facility]-Cardiac & Pulmonary Rehab    Staff Present  Heath Lark, RN, BSN, Lance Sell, BA, ACSM CEP, Exercise Physiologist;Joseph Hood RCP,RRT,BSRT    Virtual Visit  No    Medication changes reported      No    Fall or balance concerns reported     No    Warm-up and Cool-down  Performed on first and last piece of equipment    Resistance Training Performed  Yes    VAD Patient?  No    PAD/SET Patient?  No      Pain Assessment   Currently in Pain?  No/denies          Social History   Tobacco Use  Smoking Status Never Smoker  Smokeless Tobacco Never Used    Goals Met:  Independence with exercise equipment Exercise tolerated well No report of cardiac concerns or symptoms  Goals Unmet:  Not Applicable  Comments: Pt able to follow exercise prescription today without complaint.  Will continue to monitor for progression.    Dr. Emily Filbert is Medical Director for [place] and [facility].

## 2019-06-15 ENCOUNTER — Other Ambulatory Visit: Payer: Self-pay

## 2019-06-15 ENCOUNTER — Encounter: Payer: Medicare Other | Attending: Cardiology | Admitting: *Deleted

## 2019-06-15 VITALS — Ht 61.0 in | Wt 123.7 lb

## 2019-06-15 DIAGNOSIS — Z955 Presence of coronary angioplasty implant and graft: Secondary | ICD-10-CM | POA: Diagnosis present

## 2019-06-15 DIAGNOSIS — I214 Non-ST elevation (NSTEMI) myocardial infarction: Secondary | ICD-10-CM | POA: Diagnosis not present

## 2019-06-15 NOTE — Progress Notes (Signed)
Discharge Progress Report  Patient Details  Name: Katelyn Manning MRN: [id number] Date of Birth: 12-02-1950 Referring Provider:     Cardiac Rehab from 04/20/2019 in [facility] Cardiac and Pulmonary Rehab  Referring Provider  Katelyn Manning       Number of Visits: 9/36   Reason for Discharge:  Early Exit:  Personal  Smoking History:  Social History   Tobacco Use  Smoking Status Never Smoker  Smokeless Tobacco Never Used    Diagnosis:  NSTEMI (non-ST elevated myocardial infarction) ([place])  Status post coronary artery stent placement  ADL UCSD:   Initial Exercise Prescription: Initial Exercise Prescription - 04/20/19 1100      Date of Initial Exercise RX and Referring Provider   Date  04/20/19    Referring Provider  Katelyn Manning      Treadmill   MPH  2.5    Grade  1    Minutes  15    METs  2      Recumbant Bike   Level  2    RPM  50    Watts  20    Minutes  15    METs  2      Arm Ergometer   Level  1    Watts  10    RPM  15    Minutes  15    METs  1.5      REL-XR   Level  2    Watts  20    Speed  50    Minutes  15    METs  2      T5 Nustep   Level  1    SPM  50    Minutes  15    METs  2      Biostep-RELP   Level  1    SPM  60    Minutes  15    METs  2      Prescription Details   Frequency (times per week)  3    Duration  Progress to 30 minutes of continuous aerobic without signs/symptoms of physical distress      Intensity   THRR 40-80% of Max Heartrate  101-133    Ratings of Perceived Exertion  11-15    Perceived Dyspnea  0-4      Progression   Progression  Continue progressive overload as per policy without signs/symptoms or physical distress.      Resistance Training   Training Prescription  Yes    Weight  3    Reps  10-15       Discharge Exercise Prescription (Final Exercise Prescription Changes): Exercise Prescription Changes - 06/08/19 1300      Response to Exercise   Blood Pressure (Admit)  122/58    Blood Pressure (Exercise)   144/58    Blood Pressure (Exit)  124/60    Heart Rate (Admit)  56 bpm    Heart Rate (Exercise)  118 bpm    Heart Rate (Exit)  75 bpm    Rating of Perceived Exertion (Exercise)  17    Symptoms  none    Duration  Continue with 30 min of aerobic exercise without signs/symptoms of physical distress.    Intensity  THRR unchanged      Progression   Progression  Continue to progress workloads to maintain intensity without signs/symptoms of physical distress.    Average METs  3      Resistance Training   Training Prescription  Yes  Weight  3 lb    Reps  10-15      Interval Training   Interval Training  No      Arm Ergometer   Level  1    Minutes  15      Biostep-RELP   Level  2    Minutes  15    METs  3      Home Exercise Plan   Plans to continue exercise at  [facility] (comment)    Frequency  Add 3 additional days to program exercise sessions.    Initial Home Exercises Provided  05/04/19       Functional Capacity: 6 Minute Walk    Row Name 04/20/19 1113 06/15/19 0907       6 Minute Walk   Phase  Initial  Discharge    Distance  1500 feet  1650 feet    Distance % Change  -  10 %    Distance Feet Change  -  150 ft    Walk Time  6 minutes  6 minutes    # of Rest Breaks  0  0    MPH  2.84  3.13    METS  3.21  3.86    RPE  11  12    Perceived Dyspnea   1  -    VO2 Peak  11.26  13.52    Symptoms  No  No    Resting HR  69 bpm  79 bpm    Resting BP  160/80  124/70    Max Ex. HR  115 bpm  108 bpm    Max Ex. BP  164/80  152/64    2 Minute Post BP  140/80  -       Psychological, QOL, Others - Outcomes: PHQ 2/9: No flowsheet data found.  Quality of Life: Quality of Life - 04/20/19 1121      Quality of Life   Select  Quality of Life       Personal Goals: Goals established at orientation with interventions provided to work toward goal. Personal Goals and Risk Factors at Admission - 04/15/19 1037      Core Components/Risk Factors/Patient Goals on  Admission   Hypertension  Yes    Intervention  Provide education on lifestyle modifcations including regular physical activity/exercise, weight management, moderate sodium restriction and increased consumption of fresh fruit, vegetables, and low fat dairy, alcohol moderation, and smoking cessation.;Monitor prescription use compliance.    Expected Outcomes  Short Term: Continued assessment and intervention until BP is < 140/71m HG in hypertensive participants. < 130/847mHG in hypertensive participants with diabetes, heart failure or chronic kidney disease.;Long Term: Maintenance of blood pressure at goal levels.    Lipids  Yes    Intervention  Provide education and support for participant on nutrition & aerobic/resistive exercise along with prescribed medications to achieve LDL <703mHDL >1m43m  Expected Outcomes  Short Term: Participant states understanding of desired cholesterol values and is compliant with medications prescribed. Participant is following exercise prescription and nutrition guidelines.;Long Term: Cholesterol controlled with medications as prescribed, with individualized exercise RX and with personalized nutrition plan. Value goals: LDL < 70mg34mL > 40 mg.        Personal Goals Discharge: Goals and Risk Factor Review    Row Name 04/20/19 1120 05/05/19 0854 06/01/19 0858         Core Components/Risk Factors/Patient Goals Review   Personal Goals Review  Hypertension;Lipids  Weight Management/Obesity;Lipids;Hypertension  Hypertension;Lipids;Weight Management/Obesity     Review  -  She takes medication for her cholesterol. Her blood pressure at home has been better. Her systolic is in the 301S-010X as apposed to 160s. She wants to lose 5 more pounds. She and her husband are trying to lose weight together and are well on their way.  Katelyn Manning is taking meds as directed.  BP has been good at home.  She is working out at [employer] on weekends and sometimes walks during the week.      Expected Outcomes  -  Short:attend [facility] regularly. Long: graduate [facility]  Short - continue to exercise consistently Long - complete HT program        Exercise Goals and Review: Exercise Goals    Row Name 04/20/19 1119 05/04/19 1448           Exercise Goals   Increase Physical Activity  Yes  -      Intervention  Provide advice, education, support and counseling about physical activity/exercise needs.;Develop an individualized exercise prescription for aerobic and resistive training based on initial evaluation findings, risk stratification, comorbidities and participant's personal goals.  Provide advice, education, support and counseling about physical activity/exercise needs.;Develop an individualized exercise prescription for aerobic and resistive training based on initial evaluation findings, risk stratification, comorbidities and participant's personal goals.      Expected Outcomes  Short Term: Attend rehab on a regular basis to increase amount of physical activity.;Long Term: Add in home exercise to make exercise part of routine and to increase amount of physical activity.;Long Term: Exercising regularly at least 3-5 days a week.  -      Increase Strength and Stamina  Yes  -      Intervention  Provide advice, education, support and counseling about physical activity/exercise needs.;Develop an individualized exercise prescription for aerobic and resistive training based on initial evaluation findings, risk stratification, comorbidities and participant's personal goals.  -      Expected Outcomes  Short Term: Increase workloads from initial exercise prescription for resistance, speed, and METs.;Short Term: Perform resistance training exercises routinely during rehab and add in resistance training at home;Long Term: Improve cardiorespiratory fitness, muscular endurance and strength as measured by increased METs and functional capacity (6MWT)  -      Able to understand and use rate of  perceived exertion (RPE) scale  Yes  -      Intervention  Provide education and explanation on how to use RPE scale  -      Expected Outcomes  Short Term: Able to use RPE daily in rehab to express subjective intensity level;Long Term:  Able to use RPE to guide intensity level when exercising independently  -      Able to understand and use Dyspnea scale  Yes  -      Intervention  Provide education and explanation on how to use Dyspnea scale  -      Expected Outcomes  Short Term: Able to use Dyspnea scale daily in rehab to express subjective sense of shortness of breath during exertion;Long Term: Able to use Dyspnea scale to guide intensity level when exercising independently  -      Knowledge and understanding of Target Heart Rate Range (THRR)  Yes  -      Intervention  Provide education and explanation of THRR including how the numbers were predicted and where they are located for reference  -      Expected Outcomes  Short Term:  Able to state/look up THRR;Long Term: Able to use THRR to govern intensity when exercising independently;Short Term: Able to use daily as guideline for intensity in rehab  -      Able to check pulse independently  Yes  -      Intervention  Provide education and demonstration on how to check pulse in carotid and radial arteries.;Review the importance of being able to check your own pulse for safety during independent exercise  -      Expected Outcomes  Short Term: Able to explain why pulse checking is important during independent exercise;Long Term: Able to check pulse independently and accurately  -      Understanding of Exercise Prescription  Yes  -      Intervention  Provide education, explanation, and written materials on patient's individual exercise prescription  -      Expected Outcomes  Short Term: Able to explain program exercise prescription;Long Term: Able to explain home exercise prescription to exercise independently  -         Exercise Goals  Re-Evaluation: Exercise Goals Re-Evaluation    Row Name 04/21/19 0911 04/29/19 1309 05/04/19 1448 05/11/19 1053 05/25/19 1109     Exercise Goal Re-Evaluation   Exercise Goals Review  Increase Strength and Stamina;Increase Physical Activity;Able to understand and use rate of perceived exertion (RPE) scale;Able to understand and use Dyspnea scale;Knowledge and understanding of Target Heart Rate Range (THRR);Understanding of Exercise Prescription  Increase Physical Activity;Increase Strength and Stamina;Able to understand and use rate of perceived exertion (RPE) scale;Knowledge and understanding of Target Heart Rate Range (THRR);Able to check pulse independently;Understanding of Exercise Prescription  Increase Physical Activity;Increase Strength and Stamina;Able to understand and use rate of perceived exertion (RPE) scale;Knowledge and understanding of Target Heart Rate Range (THRR);Able to check pulse independently;Understanding of Exercise Prescription  Increase Physical Activity;Increase Strength and Stamina;Understanding of Exercise Prescription  Increase Physical Activity;Increase Strength and Stamina;Understanding of Exercise Prescription   Comments  Reviewed RPE scale, THR and program prescription with pt today.  Pt voiced understanding and was given a copy of goals to take home.  Mija has done well so far in rehab.  Staff will monitor progress.  Makena exercises at [employer] or walks on days not at rehab.  She tried the arm crank today and it was challenging for her.  She wants to work on upper body strength  Delesha is off to a good start in rehab.  She has completed 6 full exercise days.  She is already on 2 METs for the BioStep. We will continue to monitor her progress.  [place] has been doing well in rehab.  She is up level 2 on the BioStep and level 4 on the XR.  We will continue to monitor her progress.   Expected Outcomes  Short: Use RPE daily to regulate intensity. Long: Follow program prescription in  THR.  Short - attend consistently Long - increase overall MET level  Short - continue exercise at HT and outside Long - increase upper body strength  Short: Increase resistance on BioStep and NuStep.  Long: Continue to build stamina.  Short:Continue to move up workloads Long: Continue to increase stamina.   [place] Name 06/08/19 1331             Exercise Goal Re-Evaluation   Exercise Goals Review  Increase Physical Activity;Increase Strength and Stamina;Able to understand and use rate of perceived exertion (RPE) scale;Knowledge and understanding of Target Heart Rate Range (THRR);Able to check pulse  independently;Understanding of Exercise Prescription       Comments  Xenia does well with exercising outside program sessions at [facility].  She is consistent with exercise       Expected Outcomes  Short - continue to exercise consistently Long - increase overall MET level          Nutrition & Weight - Outcomes:    Nutrition: Nutrition Therapy & Goals - 04/20/19 1104      Nutrition Therapy   Diet  Low Na, HH diet    Protein (specify units)  45-50g    Fiber  25 grams    Whole Grain Foods  3 servings    Saturated Fats  12 max. grams    Fruits and Vegetables  5 servings/day    Sodium  1.5 grams      Personal Nutrition Goals   Nutrition Goal  ST/LT: continue [place] eating and stay healthy    Comments  Pt reports being on slimfast diet which her doctor told her is healthy as her doctor and her agreed she needed to lose weight. Discussed how BMI is not the end all be all. Mentioned that evidence shows it is most beneficial to get our vitmains and minerals from foods like whole grains, fruits and vegetables, legumes, nuts/seeds and not from supplements; supplements are meant to supplement healthy diet, not replace. food provide fiber, are more emotionally satiating, and have synergistic effects as well as giving a variety of phytonutrients. Pt reports having 2 slimfast shakes a day, some snacks like nuts  and fruit during the day, and cereal in the morning, discussed how that may not be enough calories in a day for her needs and to support her workouts. Discussed how she may gain weight while here due to the exercise and strength gain. Pt reports on weekend will "cheat" and have vegetable omelette, muffin, and oatmeal: something small for lunch like a taco at a fast food restaurant: and meat with vegetbales for dinner. Pt reports will sometimes have a candy bar. Pt reports not eating much. Discussed HH eating - RD flagged some disordered eating talk; monitor.      Intervention Plan   Intervention  Prescribe, educate and counsel regarding individualized specific dietary modifications aiming towards targeted core components such as weight, hypertension, lipid management, diabetes, heart failure and other comorbidities.;Nutrition handout(s) given to patient.    Expected Outcomes  Short Term Goal: Understand basic principles of dietary content, such as calories, fat, sodium, cholesterol and nutrients.;Short Term Goal: A plan has been developed with personal nutrition goals set during dietitian appointment.;Long Term Goal: Adherence to prescribed nutrition plan.       Nutrition Discharge:   Education Questionnaire Score:   Goals reviewed with patient; copy given to patient.

## 2019-06-15 NOTE — Progress Notes (Signed)
Cardiac Individual Treatment Plan  Patient Details  Name: Katelyn Manning MRN: [id number] Date of Birth: [birth year] Referring Provider:     Cardiac Rehab from 04/20/2019 in [facility] Cardiac and Pulmonary Rehab  Referring Provider  Paraschos      Initial Encounter Date:    Cardiac Rehab from 04/20/2019 in [facility] Cardiac and Pulmonary Rehab  Date  04/20/19      Visit Diagnosis: NSTEMI (non-ST elevated myocardial infarction) [facility])  Status post coronary artery stent placement  Patient's Home Medications on Admission:  Current Outpatient Medications:  .  aspirin 81 MG chewable tablet, Chew 81 mg by mouth daily. , Disp: , Rfl:  .  Calcium Carbonate-Vitamin D (CALCIUM 600+D) 600-200 MG-UNIT TABS, Take 1 tablet by mouth daily. , Disp: , Rfl:  .  carvedilol (COREG) 6.25 MG tablet, Take 1 tablet (6.25 mg total) by mouth 2 (two) times daily., Disp: 60 tablet, Rfl: 11 .  Cholecalciferol (VITAMIN D3) 2000 UNITS capsule, Take 2,000 Units by mouth daily. , Disp: , Rfl:  .  clopidogrel (PLAVIX) 75 MG tablet, Take 1 tablet (75 mg total) by mouth daily with breakfast., Disp: 30 tablet, Rfl: 3 .  denosumab (PROLIA) 60 MG/ML SOLN injection, Inject 60 mg into the skin every 6 (six) months. Administer in upper arm, thigh, or abdomen, Disp: , Rfl:  .  levothyroxine (SYNTHROID) 75 MCG tablet, Take 75 mcg by mouth daily. Take on an empty stomach with a glass of water at least 30-60 minutes before breakfast, Disp: , Rfl:  .  losartan (COZAAR) 25 MG tablet, Take by mouth., Disp: , Rfl:  .  Magnesium (V-R MAGNESIUM) 250 MG TABS, Take 250 mg by mouth daily., Disp: , Rfl:  .  Multiple Vitamin (MULTI-VITAMINS) TABS, Take 1 tablet by mouth daily. , Disp: , Rfl:  .  Multiple Vitamins-Minerals (ICAPS AREDS 2) CAPS, Take 2 capsules by mouth 2 (two) times daily at 8 am and 10 pm., Disp: , Rfl:  .  omeprazole (PRILOSEC) 40 MG capsule, Take 40 mg by mouth 2 (two) times daily., Disp: , Rfl:  .  simvastatin (ZOCOR) 80 MG tablet,  Take 1 tablet (80 mg total) by mouth every evening., Disp: 30 tablet, Rfl: 11 .  sucralfate (CARAFATE) 1 GM/10ML suspension, Take 10 mLs by mouth 4 (four) times daily., Disp: , Rfl:   Past Medical History: Past Medical History:  Diagnosis Date  . 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 Carcinoma of overlapping sites of left breast in female, estrogen receptor positive ([place])   . DVT (deep venous thrombosis) ([place])   . DVT of leg (deep venous thrombosis) ([place]) 1970   while on birth control  . History of left breast cancer 03/25/2010   stage 1, clinical infiltrating carcinoma, tumor size 0.9 cm; margins clear, lymph nodes negative, ER/PR positive/HER2 negative  . History of radiation therapy   . History of spinal fracture   . Hyperlipidemia   . Hypertension   . Hypothyroidism   . Macular degeneration, wet ([place])   . Osteoporosis   . Personal history of radiation therapy 2011   left breast  . Psoriasis     Tobacco Use: Social History   Tobacco Use  Smoking Status Never Smoker  Smokeless Tobacco Never Used    Labs: Recent Review Flowsheet Data    Labs for ITP Cardiac and Pulmonary Rehab Latest Ref Rng & Units 03/25/2019   Cholestrol 0 - 200 mg/dL 120  LDLCALC 0 - 99 mg/dL 62   HDL >40 mg/dL 38(L)   Trlycerides <150 mg/dL 102       Exercise Target Goals: Exercise Program Goal: Individual exercise prescription set using results from initial 6 min walk test and THRR while considering  patient's activity barriers and safety.   Exercise Prescription Goal: Initial exercise prescription builds to 30-45 minutes a day of aerobic activity, 2-3 days per week.  Home exercise guidelines will be given to patient during program as part of exercise prescription that the participant will acknowledge.  Activity Barriers & Risk Stratification: Activity Barriers & Cardiac Risk Stratification - 04/15/19 1034      Activity Barriers & Cardiac Risk  Stratification   Activity Barriers  Arthritis    Cardiac Risk Stratification  Moderate       6 Minute Walk: 6 Minute Walk    Row Name 04/20/19 1113 06/15/19 0907       6 Minute Walk   Phase  Initial  Discharge    Distance  1500 feet  1650 feet    Distance % Change  -  10 %    Distance Feet Change  -  150 ft    Walk Time  6 minutes  6 minutes    # of Rest Breaks  0  0    MPH  2.84  3.13    METS  3.21  3.86    RPE  11  12    Perceived Dyspnea   1  -    VO2 Peak  11.26  13.52    Symptoms  No  No    Resting HR  69 bpm  79 bpm    Resting BP  160/80  124/70    Max Ex. HR  115 bpm  108 bpm    Max Ex. BP  164/80  152/64    2 Minute Post BP  140/80  -       Oxygen Initial Assessment:   Oxygen Re-Evaluation:   Oxygen Discharge (Final Oxygen Re-Evaluation):   Initial Exercise Prescription: Initial Exercise Prescription - 04/20/19 1100      Date of Initial Exercise RX and Referring Provider   Date  04/20/19    Referring Provider  Paraschos      Treadmill   MPH  2.5    Grade  1    Minutes  15    METs  2      Recumbant Bike   Level  2    RPM  50    Watts  20    Minutes  15    METs  2      Arm Ergometer   Level  1    Watts  10    RPM  15    Minutes  15    METs  1.5      REL-XR   Level  2    Watts  20    Speed  50    Minutes  15    METs  2      T5 Nustep   Level  1    SPM  50    Minutes  15    METs  2      Biostep-RELP   Level  1    SPM  60    Minutes  15    METs  2      Prescription Details   Frequency (times per week)  3    Duration  Progress to 30 minutes of continuous aerobic without signs/symptoms of physical distress      Intensity   THRR 40-80% of Max Heartrate  101-133    Ratings of Perceived Exertion  11-15    Perceived Dyspnea  0-4      Progression   Progression  Continue progressive overload as per policy without signs/symptoms or physical distress.      Resistance Training   Training Prescription  Yes    Weight  3     Reps  10-15       Perform Capillary Blood Glucose checks as needed.  Exercise Prescription Changes: Exercise Prescription Changes    Row Name 04/29/19 1300 05/04/19 1400 05/11/19 1000 05/25/19 1100 06/08/19 1300     Response to Exercise   Blood Pressure (Admit)  120/60  -  146/62  136/58  122/58   Blood Pressure (Exercise)  126/64  -  142/62  140/60  144/58   Blood Pressure (Exit)  134/70  -  122/64  132/74  124/60   Heart Rate (Admit)  68 bpm  -  88 bpm  76 bpm  56 bpm   Heart Rate (Exercise)  115 bpm  -  113 bpm  114 bpm  118 bpm   Heart Rate (Exit)  86 bpm  -  94 bpm  85 bpm  75 bpm   Rating of Perceived Exertion (Exercise)  15  -  '13  13  17   '$ Symptoms  none  -  none  none  none   Duration  -  -  Continue with 30 min of aerobic exercise without signs/symptoms of physical distress.  Continue with 30 min of aerobic exercise without signs/symptoms of physical distress.  Continue with 30 min of aerobic exercise without signs/symptoms of physical distress.   Intensity  -  -  THRR unchanged  THRR unchanged  THRR unchanged     Progression   Progression  Continue to progress workloads to maintain intensity without signs/symptoms of physical distress.  -  Continue to progress workloads to maintain intensity without signs/symptoms of physical distress.  Continue to progress workloads to maintain intensity without signs/symptoms of physical distress.  Continue to progress workloads to maintain intensity without signs/symptoms of physical distress.   Average METs  3  -  2.31  2.72  3     Resistance Training   Training Prescription  Yes  -  Yes  Yes  Yes   Weight  3 lb  -  3 lbs  3 lbs  3 lb   Reps  10-15  -  10-15  10-15  10-15     Interval Training   Interval Training  No  -  No  No  No     Recumbant Bike   Level  2  -  2  2  -   RPM  50  -  -  -  -   Watts  22  -  22  22  -   Minutes  15  -  15  15  -   METs  3  -  3.14  3.14  -     Arm Ergometer   Level  -  -  1  -  1   Minutes   -  -  15  -  15   METs  -  -  2.1  -  -     REL-XR   Level  -  -  -  4  -   Minutes  -  -  -  15  -     T5 Nustep   Level  -  -  -  3  -   Minutes  -  -  -  15  -   METs  -  -  -  1.9  -     Biostep-RELP   Level  1  -  '1  2  2   '$ SPM  60  -  -  -  -   Minutes  15  -  '15  15  15   '$ METs  3  -  '2  3  3     '$ Home Exercise Plan   Plans to continue exercise at  -  [employer] (comment)  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  Add 3 additional days to program exercise sessions.  Add 3 additional days to program exercise sessions.  Add 3 additional days to program exercise sessions.   Initial Home Exercises Provided  -  05/04/19  05/04/19  05/04/19  05/04/19      Exercise Comments: Exercise Comments    Row Name 04/21/19 0911 05/04/19 1447         Exercise Comments  First full day of exercise!  Patient was oriented to gym and equipment including functions, settings, policies, and procedures.  Patient's individual exercise prescription and treatment plan were reviewed.  All starting workloads were established based on the results of the 6 minute walk test done at initial orientation visit.  The plan for exercise progression was also introduced and progression will be customized based on patient's performance and goals.  Reviewed home exercise - Sherlin exercises at [facility] and walks at home.  She will check her HR/RPE during exercise         Exercise Goals and Review: Exercise Goals    Row Name 04/20/19 1119 05/04/19 1448           Exercise Goals   Increase Physical Activity  Yes  -      Intervention  Provide advice, education, support and counseling about physical activity/exercise needs.;Develop an individualized exercise prescription for aerobic and resistive training based on initial evaluation findings, risk stratification, comorbidities and participant's  personal goals.  Provide advice, education, support and counseling about physical activity/exercise needs.;Develop an individualized exercise prescription for aerobic and resistive training based on initial evaluation findings, risk stratification, comorbidities and participant's personal goals.      Expected Outcomes  Short Term: Attend rehab on a regular basis to increase amount of physical activity.;Long Term: Add in home exercise to make exercise part of routine and to increase amount of physical activity.;Long Term: Exercising regularly at least 3-5 days a week.  -      Increase Strength and Stamina  Yes  -      Intervention  Provide advice, education, support and counseling about physical activity/exercise needs.;Develop an individualized exercise prescription for aerobic and resistive training based on initial evaluation findings, risk stratification, comorbidities and participant's personal goals.  -      Expected Outcomes  Short Term: Increase workloads from initial exercise prescription for resistance, speed, and METs.;Short Term: Perform resistance training exercises routinely during rehab and add in resistance training at home;Long Term: Improve cardiorespiratory fitness, muscular endurance and strength as measured by increased METs and functional capacity (6MWT)  -  Able to understand and use rate of perceived exertion (RPE) scale  Yes  -      Intervention  Provide education and explanation on how to use RPE scale  -      Expected Outcomes  Short Term: Able to use RPE daily in rehab to express subjective intensity level;Long Term:  Able to use RPE to guide intensity level when exercising independently  -      Able to understand and use Dyspnea scale  Yes  -      Intervention  Provide education and explanation on how to use Dyspnea scale  -      Expected Outcomes  Short Term: Able to use Dyspnea scale daily in rehab to express subjective sense of shortness of breath during exertion;Long  Term: Able to use Dyspnea scale to guide intensity level when exercising independently  -      Knowledge and understanding of Target Heart Rate Range (THRR)  Yes  -      Intervention  Provide education and explanation of THRR including how the numbers were predicted and where they are located for reference  -      Expected Outcomes  Short Term: Able to state/look up THRR;Long Term: Able to use THRR to govern intensity when exercising independently;Short Term: Able to use daily as guideline for intensity in rehab  -      Able to check pulse independently  Yes  -      Intervention  Provide education and demonstration on how to check pulse in carotid and radial arteries.;Review the importance of being able to check your own pulse for safety during independent exercise  -      Expected Outcomes  Short Term: Able to explain why pulse checking is important during independent exercise;Long Term: Able to check pulse independently and accurately  -      Understanding of Exercise Prescription  Yes  -      Intervention  Provide education, explanation, and written materials on patient's individual exercise prescription  -      Expected Outcomes  Short Term: Able to explain program exercise prescription;Long Term: Able to explain home exercise prescription to exercise independently  -         Exercise Goals Re-Evaluation : Exercise Goals Re-Evaluation    Row Name 04/21/19 0911 04/29/19 1309 05/04/19 1448 05/11/19 1053 05/25/19 1109     Exercise Goal Re-Evaluation   Exercise Goals Review  Increase Strength and Stamina;Increase Physical Activity;Able to understand and use rate of perceived exertion (RPE) scale;Able to understand and use Dyspnea scale;Knowledge and understanding of Target Heart Rate Range (THRR);Understanding of Exercise Prescription  Increase Physical Activity;Increase Strength and Stamina;Able to understand and use rate of perceived exertion (RPE) scale;Knowledge and understanding of Target  Heart Rate Range (THRR);Able to check pulse independently;Understanding of Exercise Prescription  Increase Physical Activity;Increase Strength and Stamina;Able to understand and use rate of perceived exertion (RPE) scale;Knowledge and understanding of Target Heart Rate Range (THRR);Able to check pulse independently;Understanding of Exercise Prescription  Increase Physical Activity;Increase Strength and Stamina;Understanding of Exercise Prescription  Increase Physical Activity;Increase Strength and Stamina;Understanding of Exercise Prescription   Comments  Reviewed RPE scale, THR and program prescription with pt today.  Pt voiced understanding and was given a copy of goals to take home.  Katelyn Manning has done well so far in rehab.  Staff will monitor progress.  Katelyn Manning exercises at [employer] or walks on days not at rehab.  She tried the arm  crank today and it was challenging for her.  She wants to work on upper body strength  Katelyn Manning is off to a good start in rehab.  She has completed 6 full exercise days.  She is already on 2 METs for the BioStep. We will continue to monitor her progress.  Katelyn Manning has been doing well in rehab.  She is up level 2 on the BioStep and level 4 on the XR.  We will continue to monitor her progress.   Expected Outcomes  Short: Use RPE daily to regulate intensity. Long: Follow program prescription in THR.  Short - attend consistently Long - increase overall MET level  Short - continue exercise at [facility] and outside Long - increase upper body strength  Short: Increase resistance on BioStep and NuStep.  Long: Continue to build stamina.  Short:Continue to move up workloads Long: Continue to increase stamina.   [place] Name 06/08/19 1331             Exercise Goal Re-Evaluation   Exercise Goals Review  Increase Physical Activity;Increase Strength and Stamina;Able to understand and use rate of perceived exertion (RPE) scale;Knowledge and understanding of Target Heart Rate Range (THRR);Able to check pulse  independently;Understanding of Exercise Prescription       Comments  Katelyn Manning does well with exercising outside program sessions at [facility].  She is consistent with exercise       Expected Outcomes  Short - continue to exercise consistently Long - increase overall MET level          Discharge Exercise Prescription (Final Exercise Prescription Changes): Exercise Prescription Changes - 06/08/19 1300      Response to Exercise   Blood Pressure (Admit)  122/58    Blood Pressure (Exercise)  144/58    Blood Pressure (Exit)  124/60    Heart Rate (Admit)  56 bpm    Heart Rate (Exercise)  118 bpm    Heart Rate (Exit)  75 bpm    Rating of Perceived Exertion (Exercise)  17    Symptoms  none    Duration  Continue with 30 min of aerobic exercise without signs/symptoms of physical distress.    Intensity  THRR unchanged      Progression   Progression  Continue to progress workloads to maintain intensity without signs/symptoms of physical distress.    Average METs  3      Resistance Training   Training Prescription  Yes    Weight  3 lb    Reps  10-15      Interval Training   Interval Training  No      Arm Ergometer   Level  1    Minutes  15      Biostep-RELP   Level  2    Minutes  15    METs  3      Home Exercise Plan   Plans to continue exercise at  [employer] (comment)    Frequency  Add 3 additional days to program exercise sessions.    Initial Home Exercises Provided  05/04/19       Nutrition:  Target Goals: Understanding of nutrition guidelines, daily intake of sodium '1500mg'$ , cholesterol '200mg'$ , calories 30% from fat and 7% or less from saturated fats, daily to have 5 or more servings of fruits and vegetables.  BiometricsBarbie Haggis - 06/15/19 2376       Post  Biometrics   Height  '5\' 1"'$  (1.549 m)    Weight  123 lb 11.2 oz (56.1 kg)    BMI (Calculated)  23.39       Nutrition Therapy Plan and Nutrition Goals: Nutrition Therapy & Goals - 04/20/19 1104       Nutrition Therapy   Diet  Low Na, HH diet    Protein (specify units)  45-50g    Fiber  25 grams    Whole Grain Foods  3 servings    Saturated Fats  12 max. grams    Fruits and Vegetables  5 servings/day    Sodium  1.5 grams      Personal Nutrition Goals   Nutrition Goal  ST/LT: continue [place] eating and stay healthy    Comments  Pt reports being on slimfast diet which her doctor told her is healthy as her doctor and her agreed she needed to lose weight. Discussed how BMI is not the end all be all. Mentioned that evidence shows it is most beneficial to get our vitmains and minerals from foods like whole grains, fruits and vegetables, legumes, nuts/seeds and not from supplements; supplements are meant to supplement healthy diet, not replace. food provide fiber, are more emotionally satiating, and have synergistic effects as well as giving a variety of phytonutrients. Pt reports having 2 slimfast shakes a day, some snacks like nuts and fruit during the day, and cereal in the morning, discussed how that may not be enough calories in a day for her needs and to support her workouts. Discussed how she may gain weight while here due to the exercise and strength gain. Pt reports on weekend will "cheat" and have vegetable omelette, muffin, and oatmeal: something small for lunch like a taco at a fast food restaurant: and meat with vegetbales for dinner. Pt reports will sometimes have a candy bar. Pt reports not eating much. Discussed HH eating - RD flagged some disordered eating talk; monitor.      Intervention Plan   Intervention  Prescribe, educate and counsel regarding individualized specific dietary modifications aiming towards targeted core components such as weight, hypertension, lipid management, diabetes, heart failure and other comorbidities.;Nutrition handout(s) given to patient.    Expected Outcomes  Short Term Goal: Understand basic principles of dietary content, such as calories, fat, sodium,  cholesterol and nutrients.;Short Term Goal: A plan has been developed with personal nutrition goals set during dietitian appointment.;Long Term Goal: Adherence to prescribed nutrition plan.       Nutrition Assessments:   Nutrition Goals Re-Evaluation: Nutrition Goals Re-Evaluation    [place] Name 05/11/19 0855             Goals   Nutrition Goal  ST/LT: continue with current diet       Comment  Pt does not want to make any changes. Pt woule like to continue with current diet.       Expected Outcome  ST/LT: continue with current diet          Nutrition Goals Discharge (Final Nutrition Goals Re-Evaluation): Nutrition Goals Re-Evaluation - 05/11/19 0855      Goals   Nutrition Goal  ST/LT: continue with current diet    Comment  Pt does not want to make any changes. Pt woule like to continue with current diet.    Expected Outcome  ST/LT: continue with current diet       Psychosocial: Target Goals: Acknowledge presence or absence of significant depression and/or stress, maximize coping skills, provide positive support system. Participant is able to verbalize types and ability to use techniques  and skills needed for reducing stress and depression.   Initial Review & Psychosocial Screening: Initial Psych Review & Screening - 04/15/19 1035      Initial Review   Current issues with  None Identified      Family Dynamics   Good Support System?  Yes   husband, daughter     Barriers   Psychosocial barriers to participate in program  There are no identifiable barriers or psychosocial needs.;The patient should benefit from training in stress management and relaxation.      Screening Interventions   Interventions  Encouraged to exercise;To provide support and resources with identified psychosocial needs;Provide feedback about the scores to participant    Expected Outcomes  Short Term goal: Utilizing psychosocial counselor, staff and physician to assist with identification of specific  Stressors or current issues interfering with healing process. Setting desired goal for each stressor or current issue identified.;Long Term Goal: Stressors or current issues are controlled or eliminated.;Short Term goal: Identification and review with participant of any Quality of Life or Depression concerns found by scoring the questionnaire.;Long Term goal: The participant improves quality of Life and PHQ9 Scores as seen by post scores and/or verbalization of changes       Quality of Life Scores:  Quality of Life - 04/20/19 1121      Quality of Life   Select  Quality of Life      Scores of 19 and below usually indicate a poorer quality of life in these areas.  A difference of  2-3 points is a clinically meaningful difference.  A difference of 2-3 points in the total score of the Quality of Life Index has been associated with significant improvement in overall quality of life, self-image, physical symptoms, and general health in studies assessing change in quality of life.  PHQ-9: Recent Review Flowsheet Data    There is no flowsheet data to display.     Interpretation of Total Score  Total Score Depression Severity:  1-4 = Minimal depression, 5-9 = Mild depression, 10-14 = Moderate depression, 15-19 = Moderately severe depression, 20-27 = Severe depression   Psychosocial Evaluation and Intervention:   Psychosocial Re-Evaluation: Psychosocial Re-Evaluation    [place] Name 05/05/19 0848 06/01/19 0900           Psychosocial Re-Evaluation   Current issues with  Current Stress Concerns  Current Stress Concerns      Comments  Katelyn Manning has a daughter that is sick and has seizures from time to time. Her daughters husband shot himself last year in from of her. Her daughters daughter has a drug problem and is something that is on her mind. Katelyn Manning tries to stay positive with her husband and do things. Her family works to [employer] in [place] quality scientist a Proofreader. She is going to retire this year.  Katelyn Manning has no new  stress.  Exercise helps her reduce stress as well as staying busy.      Expected Outcomes  Short: continue to attend [facility]. Long: maintain a workout routine post [facility] to keep stress at a minimum.  Short - continue to manage stress Long:  maintain low stress      Interventions  Encouraged to attend Cardiac Rehabilitation for the exercise  -      Continue Psychosocial Services   Follow up required by staff  -         Psychosocial Discharge (Final Psychosocial Re-Evaluation): Psychosocial Re-Evaluation - 06/01/19 0900      Psychosocial Re-Evaluation   Current  issues with  Current Stress Concerns    Comments  Katelyn Manning has no new stress.  Exercise helps her reduce stress as well as staying busy.    Expected Outcomes  Short - continue to manage stress Long:  maintain low stress       Vocational Rehabilitation: Provide vocational rehab assistance to qualifying candidates.   Vocational Rehab Evaluation & Intervention: Vocational Rehab - 04/15/19 1035      Initial Vocational Rehab Evaluation & Intervention   Assessment shows need for Vocational Rehabilitation  No       Education: Education Goals: Education classes will be provided on a variety of topics geared toward better understanding of heart health and risk factor modification. Participant will state understanding/return demonstration of topics presented as noted by education test scores.  Learning Barriers/Preferences: Learning Barriers/Preferences - 04/15/19 1035      Learning Barriers/Preferences   Learning Barriers  None    Learning Preferences  Individual Instruction;Group Instruction       Education Topics:  AED/CPR: - Group verbal and written instruction with the use of models to demonstrate the basic use of the AED with the basic ABC's of resuscitation.   General Nutrition Guidelines/Fats and Fiber: -Group instruction provided by verbal, written material, models and posters to present the general guidelines  for heart healthy nutrition. Gives an explanation and review of dietary fats and fiber.   Controlling Sodium/Reading Food Labels: -Group verbal and written material supporting the discussion of sodium use in heart healthy nutrition. Review and explanation with models, verbal and written materials for utilization of the food label.   Exercise Physiology & General Exercise Guidelines: - Group verbal and written instruction with models to review the exercise physiology of the cardiovascular system and associated critical values. Provides general exercise guidelines with specific guidelines to those with heart or lung disease.    Aerobic Exercise & Resistance Training: - Gives group verbal and written instruction on the various components of exercise. Focuses on aerobic and resistive training programs and the benefits of this training and how to safely progress through these programs..   Flexibility, Balance, Mind/Body Relaxation: Provides group verbal/written instruction on the benefits of flexibility and balance training, including mind/body exercise modes such as yoga, pilates and tai chi.  Demonstration and skill practice provided.   Stress and Anxiety: - Provides group verbal and written instruction about the health risks of elevated stress and causes of high stress.  Discuss the correlation between heart/lung disease and anxiety and treatment options. Review healthy ways to manage with stress and anxiety.   Depression: - Provides group verbal and written instruction on the correlation between heart/lung disease and depressed mood, treatment options, and the stigmas associated with seeking treatment.   Anatomy & Physiology of the Heart: - Group verbal and written instruction and models provide basic cardiac anatomy and physiology, with the coronary electrical and arterial systems. Review of Valvular disease and Heart Failure   Cardiac Procedures: - Group verbal and written  instruction to review commonly prescribed medications for heart disease. Reviews the medication, class of the drug, and side effects. Includes the steps to properly store meds and maintain the prescription regimen. (beta blockers and nitrates)   Cardiac Medications I: - Group verbal and written instruction to review commonly prescribed medications for heart disease. Reviews the medication, class of the drug, and side effects. Includes the steps to properly store meds and maintain the prescription regimen.   Cardiac Medications II: -Group verbal and written instruction to  review commonly prescribed medications for heart disease. Reviews the medication, class of the drug, and side effects. (all other drug classes)    Go Sex-Intimacy & Heart Disease, Get SMART - Goal Setting: - Group verbal and written instruction through game format to discuss heart disease and the return to sexual intimacy. Provides group verbal and written material to discuss and apply goal setting through the application of the S.M.A.R.T. Method.   Other Matters of the Heart: - Provides group verbal, written materials and models to describe Stable Angina and Peripheral Artery. Includes description of the disease process and treatment options available to the cardiac patient.   Exercise & Equipment Safety: - Individual verbal instruction and demonstration of equipment use and safety with use of the equipment.   Cardiac Rehab from 04/20/2019 in [facility] Cardiac [facility] Pulmonary Rehab  Date  04/20/19  Educator  [place]  Instruction Review Code  1- Verbalizes Understanding      Infection Prevention: - Provides verbal and written material to individual with discussion of infection control including proper hand washing and proper equipment cleaning during exercise session.   Cardiac Rehab from 04/20/2019 in [facility] Cardiac and Pulmonary Rehab  Date  04/20/19  Educator  [place]  Instruction Review Code  1- Verbalizes Understanding      Falls  Prevention: - Provides verbal and written material to individual with discussion of falls prevention and safety.   Cardiac Rehab from 04/20/2019 in [facility] Cardiac and Pulmonary Rehab  Date  04/20/19  Educator  [place]  Instruction Review Code  1- Verbalizes Understanding      Diabetes: - Individual verbal and written instruction to review signs/symptoms of diabetes, desired ranges of glucose level fasting, after meals and with exercise. Acknowledge that pre and post exercise glucose checks will be done for 3 sessions at entry of program.   Know Your Numbers and Risk Factors: -Group verbal and written instruction about important numbers in your health.  Discussion of what are risk factors and how they play a role in the disease process.  Review of Cholesterol, Blood Pressure, Diabetes, and BMI and the role they play in your overall health.   Sleep Hygiene: -Provides group verbal and written instruction about how sleep can affect your health.  Define sleep hygiene, discuss sleep cycles and impact of sleep habits. Review good sleep hygiene tips.    Other: -Provides group and verbal instruction on various topics (see comments)   Knowledge Questionnaire Score:   Core Components/Risk Factors/Patient Goals at Admission: Personal Goals and Risk Factors at Admission - 04/15/19 1037      Core Components/Risk Factors/Patient Goals on Admission   Hypertension  Yes    Intervention  Provide education on lifestyle modifcations including regular physical activity/exercise, weight management, moderate sodium restriction and increased consumption of fresh fruit, vegetables, and low fat dairy, alcohol moderation, and smoking cessation.;Monitor prescription use compliance.    Expected Outcomes  Short Term: Continued assessment and intervention until BP is < 140/40m HG in hypertensive participants. < 130/845mHG in hypertensive participants with diabetes, heart failure or chronic kidney disease.;Long Term:  Maintenance of blood pressure at goal levels.    Lipids  Yes    Intervention  Provide education and support for participant on nutrition & aerobic/resistive exercise along with prescribed medications to achieve LDL '70mg'$ , HDL >'40mg'$ .    Expected Outcomes  Short Term: Participant states understanding of desired cholesterol values and is compliant with medications prescribed. Participant is following exercise prescription and nutrition guidelines.;Long Term: Cholesterol  controlled with medications as prescribed, with individualized exercise RX and with personalized nutrition plan. Value goals: LDL < '70mg'$ , HDL > 40 mg.       Core Components/Risk Factors/Patient Goals Review:  Goals and Risk Factor Review    Row Name 04/20/19 1120 05/05/19 0854 06/01/19 0858         Core Components/Risk Factors/Patient Goals Review   Personal Goals Review  Hypertension;Lipids  Weight Management/Obesity;Lipids;Hypertension  Hypertension;Lipids;Weight Management/Obesity     Review  -  She takes medication for her cholesterol. Her blood pressure at home has been better. Her systolic is in the [id number] as apposed to 160s. She wants to lose 5 more pounds. She and her husband are trying to lose weight together and are well on their way.  Katelyn Manning is taking meds as directed.  BP has been good at home.  She is working out at [employer] on weekends and sometimes walks during the week.     Expected Outcomes  -  Short:attend [facility] regularly. Long: graduate [facility]  Short - continue to exercise consistently Long - complete HT program        Core Components/Risk Factors/Patient Goals at Discharge (Final Review):  Goals and Risk Factor Review - 06/01/19 0858      Core Components/Risk Factors/Patient Goals Review   Personal Goals Review  Hypertension;Lipids;Weight Management/Obesity    Review  Katelyn Manning is taking meds as directed.  BP has been good at home.  She is working out at [employer] on weekends and sometimes walks during the  week.    Expected Outcomes  Short - continue to exercise consistently Long - complete HT program       ITP Comments: ITP Comments    Row Name 04/15/19 1043 04/21/19 0605 05/19/19 1100 05/25/19 1109 06/15/19 0902   ITP Comments  Virtual Initial orientation completed. Diagnosis can be found in [facility] 8/12. EP/RD orientation scheduled for 9/8 at 9:30  30 Day review. Continue with ITP unless directed changes per Medical Director review.  New to program  30 day review completed. ITP sent to Dr. Emily Filbert, Medical Director of Cardiac and Pulmonary Rehab. Continue with ITP unless changes are made by physician.  Department closed starting 10/2 until further notice by infection prevention and Health at Work teams for [place].  Kaziah called out for week.  Delayed for scheduling- insurance or medical reason  Referral complete [facility] x 3 [place] after D/C defer to mid Nov   Row Name 06/15/19 1014           ITP Comments  Early Exit Discharged today per patient request          Comments: early discharge

## 2019-06-15 NOTE — Progress Notes (Signed)
Daily Session Note  Patient Details  Name: Katelyn Manning MRN: [id number] Date of Birth: 06-Mar-1951 Referring Provider:     Cardiac Rehab from 04/20/2019 in [facility] Cardiac and Pulmonary Rehab  Referring Provider  [place]      Encounter Date: 06/15/2019  Check In: Session Check In - 06/15/19 0859      Check-In   Supervising physician immediately available to respond to emergencies  See telemetry face sheet for immediately available ER MD    Location  [facility]-Cardiac & Pulmonary Rehab    Staff Present  Heath Lark, RN, BSN, CCRP;Joseph Foy [place], [place], ACSM CEP, Exercise Physiologist    Virtual Visit  No    Medication changes reported      No    Fall or balance concerns reported     No    Warm-up and Cool-down  Performed on first and last piece of equipment    Resistance Training Performed  Yes    VAD Patient?  No    PAD/SET Patient?  No      Pain Assessment   Currently in Pain?  No/denies          Social History   Tobacco Use  Smoking Status Never Smoker  Smokeless Tobacco Never Used    Goals Met:  Independence with exercise equipment Exercise tolerated well No report of cardiac concerns or symptoms  Goals Unmet:  Not Applicable  Comments: Pt able to follow exercise prescription today without complaint.   Bernadette graduated today from  rehab with 9/36 sessions completed.  Details of the patient's exercise prescription and what She needs to do in order to continue the prescription and progress were discussed with patient.  Patient was given a copy of prescription and goals.  Patient verbalized understanding.  Katelyn Manning plans to continue to exercise by going to her local gym.  Katelyn Manning states that she talked to  her physician and he cleared her to discharge from Cardiac Rehab    Dr. Emily Filbert is Medical Director for [place] [facility].

## 2019-06-23 ENCOUNTER — Other Ambulatory Visit: Payer: Self-pay | Admitting: Internal Medicine

## 2019-06-23 DIAGNOSIS — Z1231 Encounter for screening mammogram for malignant neoplasm of breast: Secondary | ICD-10-CM

## 2019-07-01 ENCOUNTER — Other Ambulatory Visit: Payer: Self-pay

## 2019-07-02 ENCOUNTER — Inpatient Hospital Stay: Payer: Medicare Other | Attending: Internal Medicine | Admitting: Internal Medicine

## 2019-07-02 ENCOUNTER — Other Ambulatory Visit: Payer: Self-pay

## 2019-07-02 DIAGNOSIS — Z8249 Family history of ischemic heart disease and other diseases of the circulatory system: Secondary | ICD-10-CM | POA: Diagnosis not present

## 2019-07-02 DIAGNOSIS — M810 Age-related osteoporosis without current pathological fracture: Secondary | ICD-10-CM | POA: Insufficient documentation

## 2019-07-02 DIAGNOSIS — Z79899 Other long term (current) drug therapy: Secondary | ICD-10-CM | POA: Diagnosis not present

## 2019-07-02 DIAGNOSIS — I252 Old myocardial infarction: Secondary | ICD-10-CM | POA: Insufficient documentation

## 2019-07-02 DIAGNOSIS — Z803 Family history of malignant neoplasm of breast: Secondary | ICD-10-CM | POA: Insufficient documentation

## 2019-07-02 DIAGNOSIS — Z170 Estrogen receptor positive status [ER+]: Secondary | ICD-10-CM | POA: Diagnosis not present

## 2019-07-02 DIAGNOSIS — C50812 Malignant neoplasm of overlapping sites of left female breast: Secondary | ICD-10-CM | POA: Diagnosis not present

## 2019-07-02 DIAGNOSIS — Z923 Personal history of irradiation: Secondary | ICD-10-CM | POA: Diagnosis not present

## 2019-07-02 DIAGNOSIS — Z7982 Long term (current) use of aspirin: Secondary | ICD-10-CM | POA: Diagnosis not present

## 2019-07-02 DIAGNOSIS — Z86718 Personal history of other venous thrombosis and embolism: Secondary | ICD-10-CM | POA: Diagnosis not present

## 2019-07-02 DIAGNOSIS — E039 Hypothyroidism, unspecified: Secondary | ICD-10-CM | POA: Diagnosis not present

## 2019-07-02 DIAGNOSIS — I10 Essential (primary) hypertension: Secondary | ICD-10-CM | POA: Insufficient documentation

## 2019-07-02 DIAGNOSIS — E785 Hyperlipidemia, unspecified: Secondary | ICD-10-CM | POA: Diagnosis not present

## 2019-07-02 NOTE — Assessment & Plan Note (Addendum)
#  Stage I ER/PR positive HER-2/neu negative breast cancer low risk Oncotype; LOW BCI- discontinued Arimidex- OCT 2017.  #Clinically-stable no evidence of recurrence.  Pending mammogram December 2020  #Osteoporosis - on prolia as per PCP/endocrinology; on calcium and vitamin. STABLE.   #Discussed regarding follow-up-in agreement for follow-up as needed.  # DISPOSITION:  # follow up as needed-Dr.B

## 2019-07-02 NOTE — Progress Notes (Signed)
Katelyn Manning OFFICE PROGRESS NOTE  Patient Care Team: Adin [name], MD as PCP - General (Internal Medicine)   SUMMARY OF ONCOLOGIC HISTORY: Oncology History Overview Note  # LEFT BREAST CA STAGE I ER/PR POS; Her 2 NEU-NEG s/p Lump & RT; LOW RISK ONCOTYPE- No chemo; NOV 2011- START ARIMIDEX  OCT 2017- BREAST CANCER INDEX -late risk of recurrence- 2.4%; LOW BENEFIT of extended Adjuvant therapy.;Arimidex- STOPPED OCT 2017  # OSTEOPOROSIS- on Fosomax + Vit D & ca ; BMD- STABLE osteporosis '[]'$  on prolia-Dr.Solum  DIAGNOSIS: Breast cancer left  STAGE: I        ;GOALS: cure  CURRENT/MOST RECENT THERAPY: surveillaince    Carcinoma of overlapping sites of left breast in female, estrogen receptor positive ([place])     INTERVAL HISTORY:  A pleasant 68 year-old female patient with above history of stage I breast cancer currently on surveillance is here for follow-up.   Patient was recently admitted to hospital-diagnosed with non-STEMI.  Patient needed stent placement.  Patient is recovering well.  Her appetite is good with no weight loss no shortness of breath.  Denies any new lumps or bumps.  Review of Systems  Constitutional: Negative for chills, diaphoresis, fever, malaise/fatigue and weight loss.  HENT: Negative for nosebleeds and sore throat.   Eyes: Negative for double vision.  Respiratory: Negative for cough, hemoptysis, sputum production, shortness of breath and wheezing.   Cardiovascular: Negative for chest pain, palpitations, orthopnea and leg swelling.  Gastrointestinal: Negative for abdominal pain, blood in stool, constipation, diarrhea, heartburn, melena, nausea and vomiting.  Genitourinary: Negative for dysuria, frequency and urgency.  Musculoskeletal: Negative for back pain and joint pain.  Skin: Negative.  Negative for itching and rash.  Neurological: Negative for dizziness, tingling, focal weakness, weakness and headaches.  Endo/Heme/Allergies: Does not  bruise/bleed easily.  Psychiatric/Behavioral: Negative for depression. The patient is not nervous/anxious and does not have insomnia.      PAST MEDICAL HISTORY :  Past Medical History:  Diagnosis Date  . 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 Carcinoma of overlapping sites of left breast in female, estrogen receptor positive ([place])   . DVT (deep venous thrombosis) ([place])   . DVT of leg (deep venous thrombosis) ([place]) 1970   while on birth control  . History of left breast cancer 03/25/2010   stage 1, clinical infiltrating carcinoma, tumor size 0.9 cm; margins clear, lymph nodes negative, ER/PR positive/HER2 negative  . History of radiation therapy   . History of spinal fracture   . Hyperlipidemia   . Hypertension   . Hypothyroidism   . Macular degeneration, wet ([place])   . Osteoporosis   . Personal history of radiation therapy 2011   left breast  . Psoriasis     PAST SURGICAL HISTORY :   Past Surgical History:  Procedure Laterality Date  . BREAST BIOPSY Left 03/08/2010   Tubular carcinoma  . BREAST LUMPECTOMY Left 03/28/2010   tubular carcinoma and 2 benign LN  . COLONOSCOPY WITH PROPOFOL N/A 03/27/2017   Procedure: COLONOSCOPY WITH PROPOFOL;  Surgeon: Lollie Sails, MD;  Location[facility] ENDOSCOPY;  Service: Endoscopy;  Laterality: N/A;  . CORONARY STENT INTERVENTION N/A 03/25/2019   Procedure: CORONARY STENT INTERVENTION;  Surgeon: Isaias Cowman, MD;  Location: [place] CV LAB;  Service: Cardiovascular;  Laterality: N/A;  . FOOT SURGERY  1999  . LEFT HEART CATH AND CORONARY ANGIOGRAPHY N/A 03/25/2019   Procedure: LEFT  HEART CATH AND CORONARY ANGIOGRAPHY;  Surgeon: Corey Skains, MD;  Location: [place] CV LAB;  Service: Cardiovascular;  Laterality: N/A;  . TUBAL LIGATION      FAMILY HISTORY :   Family History  Problem Relation Age of Onset  . Breast cancer Maternal Aunt 70  . Diabetes Other   . Hypertension  Other   . Stroke Other   . Diabetes Brother   . Heart disease Brother   . Heart attack Brother     SOCIAL HISTORY:   Social History   Tobacco Use  . Smoking status: Never Smoker  . Smokeless tobacco: Never Used  Substance Use Topics  . Alcohol use: Yes    Alcohol/week: 0.0 standard drinks    Comment: occasional alcohol use 2-3 times a week  . Drug use: No    ALLERGIES:  is allergic to formaldehyde.  MEDICATIONS:  Current Outpatient Medications  Medication Sig Dispense Refill  . aspirin 81 MG chewable tablet Chew 81 mg by mouth daily.     . Calcium Carbonate-Vitamin D (CALCIUM 600+D) 600-200 MG-UNIT TABS Take 1 tablet by mouth daily.     . carvedilol (COREG) 6.25 MG tablet Take 1 tablet (6.25 mg total) by mouth 2 (two) times daily. 60 tablet 11  . Cholecalciferol (VITAMIN D3) 2000 UNITS capsule Take 2,000 Units by mouth daily.     . clopidogrel (PLAVIX) 75 MG tablet Take 1 tablet (75 mg total) by mouth daily with breakfast. 30 tablet 3  . denosumab (PROLIA) 60 MG/ML SOLN injection Inject 60 mg into the skin every 6 (six) months. Administer in upper arm, thigh, or abdomen    . levothyroxine (SYNTHROID) 75 MCG tablet Take 75 mcg by mouth daily. Take on an empty stomach with a glass of water at least 30-60 minutes before breakfast    . losartan (COZAAR) 25 MG tablet Take by mouth.    . Multiple Vitamin (MULTI-VITAMINS) TABS Take 1 tablet by mouth daily.     . Multiple Vitamins-Minerals (ICAPS AREDS 2) CAPS Take 2 capsules by mouth 2 (two) times daily at 8 am and 10 pm.    . omeprazole (PRILOSEC) 40 MG capsule Take 40 mg by mouth 2 (two) times daily.    . rosuvastatin (CRESTOR) 20 MG tablet Take 1 tablet by mouth daily.    . Magnesium (V-R MAGNESIUM) 250 MG TABS Take 250 mg by mouth daily.    . simvastatin (ZOCOR) 80 MG tablet Take 1 tablet (80 mg total) by mouth every evening. 30 tablet 11  . sucralfate (CARAFATE) 1 GM/10ML suspension Take 10 mLs by mouth 4 (four) times daily.      No current facility-administered medications for this visit.     PHYSICAL EXAMINATION: ECOG PERFORMANCE STATUS: 0 - Asymptomatic  BP (!) 146/61 (BP Location: Left Arm, Patient Position: Sitting, Cuff Size: Normal)   Pulse 67   Temp 98.5 F (36.9 C) (Tympanic)   Resp 20   Ht '5\' 1"'$  (1.549 m)   Wt 121 lb 11.2 oz (55.2 kg)   BMI 23.00 kg/m   Filed Weights   07/02/19 1050  Weight: 121 lb 11.2 oz (55.2 kg)    Physical Exam  Constitutional: She is oriented to person, place, and time and well-developed, well-nourished, and in no distress.  HENT:  Head: Normocephalic and atraumatic.  Mouth/Throat: Oropharynx is clear and moist. No oropharyngeal exudate.  Eyes: Pupils are equal, round, and reactive to light.  Neck: Normal range of motion. Neck supple.  Cardiovascular: Normal rate and regular rhythm.  Pulmonary/Chest: No respiratory distress. She has no wheezes.  Abdominal: Soft. Bowel sounds are normal. She exhibits no distension and no mass. There is no abdominal tenderness. There is no rebound and no guarding.  Musculoskeletal: Normal range of motion.        General: No tenderness or edema.  Neurological: She is alert and oriented to person, place, and time.  Skin: Skin is warm.  Right and left BREAST exam (in the presence of nurse)- no unusual skin changes or dominant masses felt. Surgical scars noted.    Psychiatric: Affect normal.      LABORATORY DATA:  I have reviewed the data as listed    Component Value Date/Time   NA 139 03/26/2019 0751   K 3.7 03/26/2019 0751   CL 111 03/26/2019 0751   CO2 23 03/26/2019 0751   GLUCOSE 106 (H) 03/26/2019 0751   BUN 7 (L) 03/26/2019 0751   CREATININE 0.58 03/26/2019 0751   CREATININE 0.77 05/11/2014 1030   CALCIUM 7.7 (L) 03/26/2019 0751   PROT 5.1 (L) 03/24/2019 2151   PROT 7.5 05/11/2014 1030   ALBUMIN 2.8 (L) 03/24/2019 2151   ALBUMIN 3.9 05/11/2014 1030   AST 57 (H) 03/24/2019 2151   AST 25 05/11/2014 1030   ALT 19  03/24/2019 2151   ALT 26 05/11/2014 1030   ALKPHOS 59 03/24/2019 2151   ALKPHOS 73 05/11/2014 1030   BILITOT 0.5 03/24/2019 2151   BILITOT 0.3 05/11/2014 1030   GFRNONAA >60 03/26/2019 0751   GFRNONAA >60 05/11/2014 1030   GFRNONAA >60 05/10/2013 1042   GFRAA >60 03/26/2019 0751   GFRAA >60 05/11/2014 1030   GFRAA >60 05/10/2013 1042    No results found for: SPEP, UPEP  Lab Results  Component Value Date   WBC 9.0 03/26/2019   NEUTROABS 7.3 03/24/2019   HGB 11.3 (L) 03/26/2019   HCT 34.5 (L) 03/26/2019   MCV 95.0 03/26/2019   PLT 201 03/26/2019      Chemistry      Component Value Date/Time   NA 139 03/26/2019 0751   K 3.7 03/26/2019 0751   CL 111 03/26/2019 0751   CO2 23 03/26/2019 0751   BUN 7 (L) 03/26/2019 0751   CREATININE 0.58 03/26/2019 0751   CREATININE 0.77 05/11/2014 1030      Component Value Date/Time   CALCIUM 7.7 (L) 03/26/2019 0751   ALKPHOS 59 03/24/2019 2151   ALKPHOS 73 05/11/2014 1030   AST 57 (H) 03/24/2019 2151   AST 25 05/11/2014 1030   ALT 19 03/24/2019 2151   ALT 26 05/11/2014 1030   BILITOT 0.5 03/24/2019 2151   BILITOT 0.3 05/11/2014 1030       RADIOGRAPHIC STUDIES: I have personally reviewed the radiological images as listed and agreed with the findings in the report. No results found.   ASSESSMENT & PLAN:   Carcinoma of overlapping sites of left breast in female, estrogen receptor positive ([place]) # Stage I ER/PR positive HER-2/neu negative breast cancer low risk Oncotype; LOW BCI- discontinued Arimidex- OCT 2017.  #Clinically-stable no evidence of recurrence.  Pending mammogram December 2020  #Osteoporosis - on prolia as per PCP/endocrinology; on calcium and vitamin. STABLE.   #Discussed regarding follow-up-in agreement for follow-up as needed.  # DISPOSITION:  # follow up as needed-Dr.B   No orders of the defined types were placed in this encounter.      Katelyn Sickle, MD 07/02/2019 11:39 AM

## 2019-07-26 ENCOUNTER — Ambulatory Visit
Admission: RE | Admit: 2019-07-26 | Discharge: 2019-07-26 | Disposition: A | Discharge status: Home / Self Care | Payer: Medicare Other | Source: Ambulatory Visit | Attending: Internal Medicine | Admitting: Internal Medicine

## 2019-07-26 DIAGNOSIS — Z1231 Encounter for screening mammogram for malignant neoplasm of breast: Secondary | ICD-10-CM

## 2020-08-14 ENCOUNTER — Other Ambulatory Visit: Payer: Self-pay | Admitting: Internal Medicine

## 2020-08-14 DIAGNOSIS — Z1231 Encounter for screening mammogram for malignant neoplasm of breast: Secondary | ICD-10-CM

## 2020-08-18 ENCOUNTER — Ambulatory Visit
Admission: RE | Admit: 2020-08-18 | Discharge: 2020-08-18 | Disposition: A | Discharge status: Home / Self Care | Payer: Medicare Other | Source: Ambulatory Visit | Attending: Internal Medicine | Admitting: Internal Medicine

## 2020-08-18 ENCOUNTER — Other Ambulatory Visit: Payer: Self-pay

## 2020-08-18 DIAGNOSIS — Z1231 Encounter for screening mammogram for malignant neoplasm of breast: Secondary | ICD-10-CM | POA: Insufficient documentation

## 2022-10-23 ENCOUNTER — Other Ambulatory Visit: Payer: Self-pay | Admitting: Internal Medicine

## 2022-10-23 DIAGNOSIS — Z1239 Encounter for other screening for malignant neoplasm of breast: Secondary | ICD-10-CM

## 2022-10-30 ENCOUNTER — Ambulatory Visit
Admission: RE | Admit: 2022-10-30 | Discharge: 2022-10-30 | Disposition: A | Discharge status: Home / Self Care | Payer: Medicare Other | Source: Ambulatory Visit | Attending: Internal Medicine | Admitting: Internal Medicine

## 2022-10-30 DIAGNOSIS — Z1239 Encounter for other screening for malignant neoplasm of breast: Secondary | ICD-10-CM | POA: Insufficient documentation

## 2022-10-30 MED ORDER — GADOBUTROL 1 MMOL/ML IV SOLN
5.0000 mL | Freq: Once | INTRAVENOUS | Status: AC | PRN
  Administered 2022-10-30: 5 mL via INTRAVENOUS

## 2023-09-23 ENCOUNTER — Encounter: Payer: Self-pay | Admitting: Internal Medicine

## 2023-09-29 ENCOUNTER — Encounter: Payer: Self-pay | Admitting: Internal Medicine

## 2023-09-30 ENCOUNTER — Encounter
Admission: RE | Disposition: A | Discharge status: Home / Self Care | Payer: Self-pay | Source: Home / Self Care | Attending: Internal Medicine

## 2023-09-30 ENCOUNTER — Ambulatory Visit: Payer: Medicare Other | Admitting: Certified Registered Nurse Anesthetist

## 2023-09-30 ENCOUNTER — Ambulatory Visit
Admission: RE | Admit: 2023-09-30 | Discharge: 2023-09-30 | Disposition: A | Discharge status: Home / Self Care | Payer: Medicare Other | Attending: Internal Medicine | Admitting: Internal Medicine

## 2023-09-30 ENCOUNTER — Encounter: Payer: Self-pay | Admitting: Internal Medicine

## 2023-09-30 DIAGNOSIS — Z1211 Encounter for screening for malignant neoplasm of colon: Secondary | ICD-10-CM | POA: Diagnosis present

## 2023-09-30 DIAGNOSIS — Z79890 Hormone replacement therapy (postmenopausal): Secondary | ICD-10-CM | POA: Diagnosis not present

## 2023-09-30 DIAGNOSIS — I10 Essential (primary) hypertension: Secondary | ICD-10-CM | POA: Insufficient documentation

## 2023-09-30 DIAGNOSIS — K640 First degree hemorrhoids: Secondary | ICD-10-CM | POA: Diagnosis not present

## 2023-09-30 DIAGNOSIS — K219 Gastro-esophageal reflux disease without esophagitis: Secondary | ICD-10-CM | POA: Diagnosis not present

## 2023-09-30 DIAGNOSIS — I2510 Atherosclerotic heart disease of native coronary artery without angina pectoris: Secondary | ICD-10-CM | POA: Insufficient documentation

## 2023-09-30 DIAGNOSIS — Z79899 Other long term (current) drug therapy: Secondary | ICD-10-CM | POA: Diagnosis not present

## 2023-09-30 DIAGNOSIS — K5730 Diverticulosis of large intestine without perforation or abscess without bleeding: Secondary | ICD-10-CM | POA: Insufficient documentation

## 2023-09-30 DIAGNOSIS — E039 Hypothyroidism, unspecified: Secondary | ICD-10-CM | POA: Insufficient documentation

## 2023-09-30 DIAGNOSIS — D120 Benign neoplasm of cecum: Secondary | ICD-10-CM | POA: Insufficient documentation

## 2023-09-30 HISTORY — PX: POLYPECTOMY: SHX5525

## 2023-09-30 HISTORY — DX: Atherosclerotic heart disease of native coronary artery without angina pectoris: I25.10

## 2023-09-30 HISTORY — PX: COLONOSCOPY WITH PROPOFOL: SHX5780

## 2023-09-30 SURGERY — COLONOSCOPY WITH PROPOFOL
Anesthesia: General

## 2023-09-30 MED ORDER — PROPOFOL 10 MG/ML IV BOLUS
INTRAVENOUS | Status: DC | PRN
  Administered 2023-09-30: 50 mg via INTRAVENOUS
  Administered 2023-09-30: 20 mg via INTRAVENOUS

## 2023-09-30 MED ORDER — DEXMEDETOMIDINE HCL IN NACL 80 MCG/20ML IV SOLN
INTRAVENOUS | Status: AC
  Filled 2023-09-30: qty 20

## 2023-09-30 MED ORDER — SODIUM CHLORIDE 0.9 % IV SOLN: INTRAVENOUS | Status: DC

## 2023-09-30 MED ORDER — PROPOFOL 500 MG/50ML IV EMUL
INTRAVENOUS | Status: DC | PRN
  Administered 2023-09-30: 110 ug/kg/min via INTRAVENOUS

## 2023-09-30 NOTE — Interval H&P Note (Signed)
History and Physical Interval Note:  09/30/2023 9:18 AM  Katelyn Manning  has presented today for surgery, with the diagnosis of Z86.0100 (ICD-10-CM) - History of colon polyps.  The various methods of treatment have been discussed with the patient and family. After consideration of risks, benefits and other options for treatment, the patient has consented to  Procedure(s): COLONOSCOPY WITH PROPOFOL (N/A) as a surgical intervention.  The patient's history has been reviewed, patient examined, no change in status, stable for surgery.  I have reviewed the patient's chart and labs.  Questions were answered to the patient's satisfaction.     [place], [place]

## 2023-09-30 NOTE — Transfer of Care (Signed)
Immediate Anesthesia Transfer of Care Note  Patient: Katelyn Manning  Procedure(s) Performed: COLONOSCOPY WITH PROPOFOL POLYPECTOMY  Patient Location: PACU  Anesthesia Type:General  Level of Consciousness: awake, alert , and oriented  Airway & Oxygen Therapy: Patient Spontanous Breathing  Post-op Assessment: Report given to RN and Post -op Vital signs reviewed and stable  Post vital signs: Reviewed and stable  Last Vitals:  Vitals Value Taken Time  BP    Temp    Pulse 60 09/30/23 1019  Resp 18 09/30/23 1019  SpO2 98 % 09/30/23 1019  Vitals shown include unfiled device data.  Last Pain:  Vitals:   09/30/23 0928  TempSrc: Temporal  PainSc: 0-No pain         Complications: No notable events documented.

## 2023-09-30 NOTE — Anesthesia Preprocedure Evaluation (Signed)
Anesthesia Evaluation  Patient identified by MRN, date of birth, ID band Patient awake    Reviewed: Allergy & Precautions, H&P , NPO status , Patient's Chart, lab work & pertinent test results, reviewed documented beta blocker date and time   History of Anesthesia Complications Negative for: history of anesthetic complications  Airway Mallampati: I  TM Distance: >3 FB Neck ROM: full    Dental  (+) Dental Advidsory Given, Caps, Implants, Teeth Intact, Missing   Pulmonary neg pulmonary ROS   Pulmonary exam normal breath sounds clear to auscultation       Cardiovascular Exercise Tolerance: Good hypertension, (-) angina + CAD, + Past MI and + Cardiac Stents  (-) CABG Normal cardiovascular exam(-) dysrhythmias (-) Valvular Problems/Murmurs Rhythm:regular Rate:Normal     Neuro/Psych negative neurological ROS  negative psych ROS   GI/Hepatic Neg liver ROS,GERD  Medicated and Controlled,,  Endo/Other  neg diabetesHypothyroidism    Renal/GU negative Renal ROS  negative genitourinary   Musculoskeletal   Abdominal   Peds  Hematology negative hematology ROS (+)   Anesthesia Other Findings Past Medical History: 2011: Breast cancer (HCC)     Comment:  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 No date: Carcinoma of overlapping sites of left breast in female,  estrogen receptor positive (HCC) No date: Coronary artery disease No date: DVT (deep venous thrombosis) (HCC) 1970: DVT of leg (deep venous thrombosis) (HCC)     Comment:  while on birth control 03/25/2010: History of left breast cancer     Comment:  stage 1, clinical infiltrating carcinoma, tumor size 0.9              cm; margins clear, lymph nodes negative, ER/PR               positive/HER2 negative No date: History of radiation therapy No date: History of spinal fracture No date: Hyperlipidemia No date: Hypertension No date: Hypothyroidism No date:  Macular degeneration, wet (HCC) No date: Osteoporosis 2011: Personal history of radiation therapy     Comment:  left breast No date: Psoriasis   Reproductive/Obstetrics negative OB ROS                             Anesthesia Physical Anesthesia Plan  ASA: 3  Anesthesia Plan: General   Post-op Pain Management:    Induction: Intravenous  PONV Risk Score and Plan: 3 and Propofol infusion, TIVA and Treatment may vary due to age or medical condition  Airway Management Planned: Natural Airway and Nasal Cannula  Additional Equipment:   Intra-op Plan:   Post-operative Plan:   Informed Consent: I have reviewed the patients History and Physical, chart, labs and discussed the procedure including the risks, benefits and alternatives for the proposed anesthesia with the patient or authorized representative who has indicated his/her understanding and acceptance.     Dental Advisory Given  Plan Discussed with: Anesthesiologist, CRNA and Surgeon  Anesthesia Plan Comments:        Anesthesia Quick Evaluation

## 2023-09-30 NOTE — Interval H&P Note (Signed)
History and Physical Interval Note:  09/30/2023 9:58 AM  Katelyn Manning  has presented today for surgery, with the diagnosis of Z86.0100 (ICD-10-CM) - History of colon polyps.  The various methods of treatment have been discussed with the patient and family. After consideration of risks, benefits and other options for treatment, the patient has consented to  Procedure(s): COLONOSCOPY WITH PROPOFOL (N/A) as a surgical intervention.  The patient's history has been reviewed, patient examined, no change in status, stable for surgery.  I have reviewed the patient's chart and labs.  Questions were answered to the patient's satisfaction.     [place], [place]

## 2023-09-30 NOTE — Op Note (Signed)
[facility] Gastroenterology Patient Name: Katelyn Manning Procedure Date: 09/30/2023 10:01 AM MRN: [id number] Account #: [id number] Date of Birth: March 18, 1951 Admit Type: Outpatient Age: 73 Room: [facility] ENDO ROOM 4 Gender: Female Note Status: Finalized Instrument Name: Prentice Docker [id number] Procedure:             Colonoscopy Indications:           High risk colon cancer surveillance: Personal history                         of non-advanced adenoma Providers:             Boykin Nearing. Norma Fredrickson MD, MD Referring MD:          Boykin Nearing. Norma Fredrickson MD, MD (Referring MD), Daniel Nones,                         MD (Referring MD) Medicines:             Propofol per Anesthesia Complications:         No immediate complications. Estimated blood loss:                         Minimal. Procedure:             Pre-Anesthesia Assessment:                        - The risks and benefits of the procedure and the                         sedation options and risks were discussed with the                         patient. All questions were answered and informed                         consent was obtained.                        - Patient identification and proposed procedure were                         verified prior to the procedure by the nurse. The                         procedure was verified in the procedure room.                        - ASA Grade Assessment: III - A patient with severe                         systemic disease.                        - After reviewing the risks and benefits, the patient                         was deemed in satisfactory condition to undergo the  procedure.                        After obtaining informed consent, the colonoscope was                         passed under direct vision. Throughout the procedure,                         the patient's blood pressure, pulse, and oxygen                         saturations were monitored  continuously. The                         Colonoscope was introduced through the anus and                         advanced to the the cecum, identified by appendiceal                         orifice and ileocecal valve. The colonoscopy was                         performed without difficulty. The patient tolerated                         the procedure well. The quality of the bowel                         preparation was good. The ileocecal valve, appendiceal                         orifice, and rectum were photographed. Findings:      The perianal and digital rectal examinations were normal. Pertinent       negatives include normal sphincter tone and no palpable rectal lesions.      Non-bleeding internal hemorrhoids were found during retroflexion. The       hemorrhoids were Grade I (internal hemorrhoids that do not prolapse).      A 3 mm polyp was found in the ileocecal valve. The polyp was sessile.       The polyp was removed with a cold biopsy forceps. Resection and       retrieval were complete.      Many small-mouthed diverticula were found in the sigmoid colon.       Estimated blood loss: none.      The exam was otherwise without abnormality. Impression:            - Non-bleeding internal hemorrhoids.                        - One 3 mm polyp at the ileocecal valve, removed with                         a cold biopsy forceps. Resected and retrieved.                        - Diverticulosis in the sigmoid colon.                        -  The examination was otherwise normal. Recommendation:        - Patient has a contact number available for                         emergencies. The signs and symptoms of potential                         delayed complications were discussed with the patient.                         Return to normal activities tomorrow. Written                         discharge instructions were provided to the patient.                        - Resume previous diet.                         - Continue present medications.                        - If polyps are benign or adenomatous without                         dysplasia, I will advise NO further colonoscopy due to                         advanced age and/or severe comorbidity.                        - Return to GI office PRN.                        - The findings and recommendations were discussed with                         the patient. Procedure Code(s):     --- Professional ---                        [phone], Colonoscopy, flexible; with biopsy, single or                         multiple Diagnosis Code(s):     --- Professional ---                        K57.30, Diverticulosis of large intestine without                         perforation or abscess without bleeding                        K64.0, First degree hemorrhoids                        D12.0, Benign neoplasm of cecum                        Z86.010, Personal history of colonic polyps CPT copyright 2022 [employer]. All  rights reserved. The codes documented in this report are preliminary and upon coder review may  be revised to meet current compliance requirements. Stanton Kidney MD, MD 09/30/2023 10:19:14 AM This report has been signed electronically. Number of Addenda: 0 Note Initiated On: 09/30/2023 10:01 AM Scope Withdrawal Time: 0 hours 5 minutes 39 seconds  Total Procedure Duration: 0 hours 8 minutes 41 seconds  Estimated Blood Loss:  Estimated blood loss was minimal.      [facility]

## 2023-09-30 NOTE — Interval H&P Note (Signed)
History and Physical Interval Note:  09/30/2023 9:17 AM  Katelyn Manning  has presented today for surgery, with the diagnosis of Z86.0100 (ICD-10-CM) - History of colon polyps.  The various methods of treatment have been discussed with the patient and family. After consideration of risks, benefits and other options for treatment, the patient has consented to  Procedure(s): COLONOSCOPY WITH PROPOFOL (N/A) as a surgical intervention.  The patient's history has been reviewed, patient examined, no change in status, stable for surgery.  I have reviewed the patient's chart and labs.  Questions were answered to the patient's satisfaction.     [place], [place]

## 2023-09-30 NOTE — H&P (Signed)
Outpatient short stay form Pre-procedure 09/30/2023 9:15 AM Sarissa Dern K. Norma Fredrickson, M.D.  Primary Physician: Daniel Nones III, M.D.  Reason for visit:  Personal history of adenomatous colon polyps (2018)  History of present illness:                            Patient presents for colonoscopy for a personal hx of colon polyps. The patient denies abdominal pain, abnormal weight loss or rectal bleeding.     No current facility-administered medications for this encounter.  Medications Prior to Admission  Medication Sig Dispense Refill Last Dose/Taking   aspirin 81 MG chewable tablet Chew 81 mg by mouth daily.       Calcium Carbonate-Vitamin D (CALCIUM 600+D) 600-200 MG-UNIT TABS Take 1 tablet by mouth daily.       carvedilol (COREG) 6.25 MG tablet Take 1 tablet (6.25 mg total) by mouth 2 (two) times daily. 60 tablet 11    Cholecalciferol (VITAMIN D3) 2000 UNITS capsule Take 2,000 Units by mouth daily.       clopidogrel (PLAVIX) 75 MG tablet Take 1 tablet (75 mg total) by mouth daily with breakfast. 30 tablet 3    denosumab (PROLIA) 60 MG/ML SOLN injection Inject 60 mg into the skin every 6 (six) months. Administer in upper arm, thigh, or abdomen      levothyroxine (SYNTHROID) 75 MCG tablet Take 75 mcg by mouth daily. Take on an empty stomach with a glass of water at least 30-60 minutes before breakfast      losartan (COZAAR) 25 MG tablet Take by mouth.      Magnesium (V-R MAGNESIUM) 250 MG TABS Take 250 mg by mouth daily.      Multiple Vitamin (MULTI-VITAMINS) TABS Take 1 tablet by mouth daily.       Multiple Vitamins-Minerals (ICAPS AREDS 2) CAPS Take 2 capsules by mouth 2 (two) times daily at 8 am and 10 pm.      omeprazole (PRILOSEC) 40 MG capsule Take 40 mg by mouth 2 (two) times daily.      simvastatin (ZOCOR) 80 MG tablet Take 1 tablet (80 mg total) by mouth every evening. 30 tablet 11    sucralfate (CARAFATE) 1 GM/10ML suspension Take 10 mLs by mouth 4 (four) times daily.        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   Carcinoma of overlapping sites of left breast in female, estrogen receptor positive (HCC)    Coronary artery disease    DVT (deep venous thrombosis) (HCC)    DVT of leg (deep venous thrombosis) (HCC) 1970   while on birth control   History of left breast cancer 03/25/2010   stage 1, clinical infiltrating carcinoma, tumor size 0.9 cm; margins clear, lymph nodes negative, ER/PR positive/HER2 negative   History of radiation therapy    History of spinal fracture    Hyperlipidemia    Hypertension    Hypothyroidism    Macular degeneration, wet (HCC)    Osteoporosis    Personal history of radiation therapy 2011   left breast   Psoriasis     Review of systems:  Otherwise negative.    Physical Exam  Gen: Alert, oriented. Appears stated age.  HEENT: [place]/AT. PERRLA. Lungs: CTA, no wheezes. CV: RR nl S1, S2. Abd: soft, benign,  no masses. BS+ Ext: No edema. Pulses 2+    Planned procedures: Proceed with colonoscopy. The patient understands the nature of the planned procedure, indications, risks, alternatives and potential complications including but not limited to bleeding, infection, perforation, damage to internal organs and possible oversedation/side effects from anesthesia. The patient agrees and gives consent to proceed.  Please refer to procedure notes for findings, recommendations and patient disposition/instructions.     Rhilyn Battle K. Norma Fredrickson, M.D. Gastroenterology 09/30/2023  9:15 AM

## 2023-09-30 NOTE — Anesthesia Postprocedure Evaluation (Signed)
Anesthesia Post Note  Patient: Katelyn Manning  Procedure(s) Performed: COLONOSCOPY WITH PROPOFOL POLYPECTOMY  Patient location during evaluation: Endoscopy Anesthesia Type: General Level of consciousness: awake and alert Pain management: pain level controlled Vital Signs Assessment: post-procedure vital signs reviewed and stable Respiratory status: spontaneous breathing, nonlabored ventilation, respiratory function stable and patient connected to nasal cannula oxygen Cardiovascular status: blood pressure returned to baseline and stable Postop Assessment: no apparent nausea or vomiting Anesthetic complications: no   No notable events documented.   Last Vitals:  Vitals:   09/30/23 0928 09/30/23 1019  BP: (!) 163/63   Pulse: (!) 56   Resp: 16   Temp: (!) 36.1 C (!) 35.8 C  SpO2: 100%     Last Pain:  Vitals:   09/30/23 1019  TempSrc:   PainSc: 0-No pain                 Lenard Simmer

## 2023-09-30 NOTE — Anesthesia Procedure Notes (Signed)
Date/Time: 09/30/2023 10:02 AM  Performed by: Malva Cogan, CRNAPre-anesthesia Checklist: Patient identified, Emergency Drugs available, Suction available, Patient being monitored and Timeout performed Patient Re-evaluated:Patient Re-evaluated prior to induction Oxygen Delivery Method: Nasal cannula Induction Type: IV induction Placement Confirmation: CO2 detector and positive ETCO2

## 2023-10-01 LAB — SURGICAL PATHOLOGY
